# Patient Record
Sex: Female | Born: 1940 | Hispanic: Refuse to answer | Marital: Married
Health system: Midwestern US, Academic
[De-identification: ages and names within clinical notes are randomized; demographics above are authoritative.]

---

## 2017-01-14 IMAGING — US DUPRENAL
1 series · 14 of 25 positions shown · non-contrast
Comparison: none

[Series 1: us duplex renal artery · 14 of 41 slices shown]
[im 1/41]
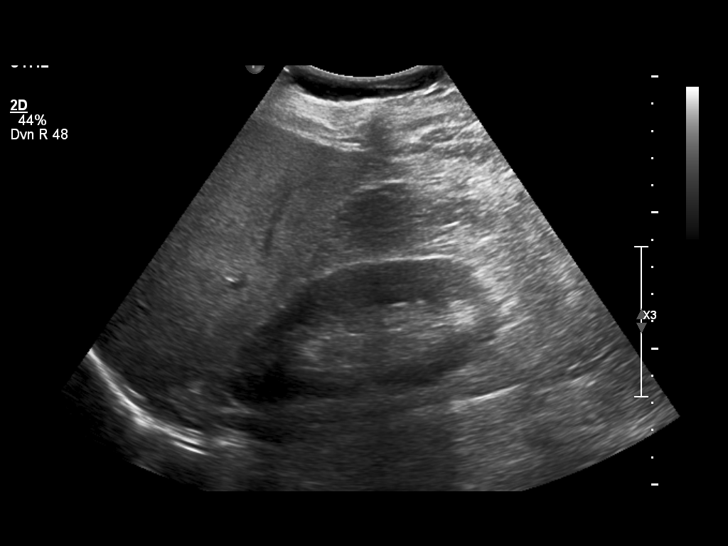
[im 4/41]
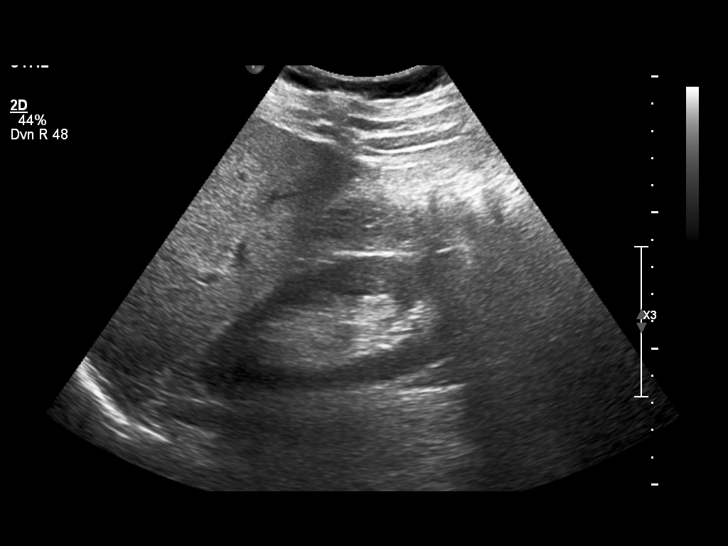
[im 7/41]
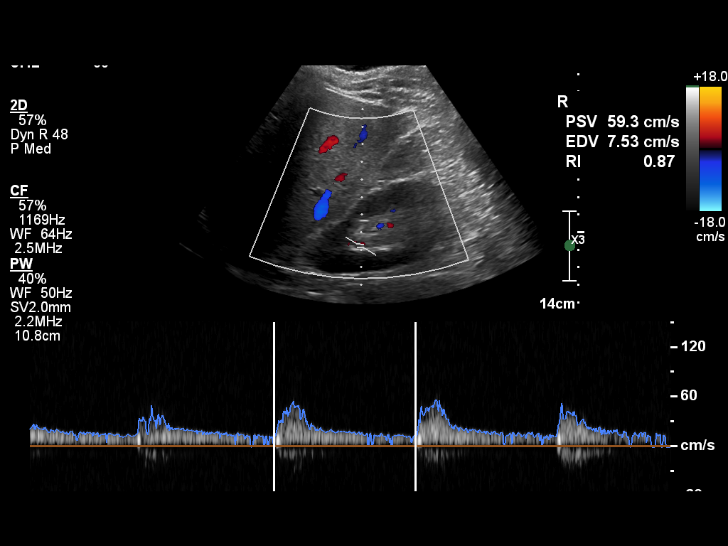
[im 11/41]
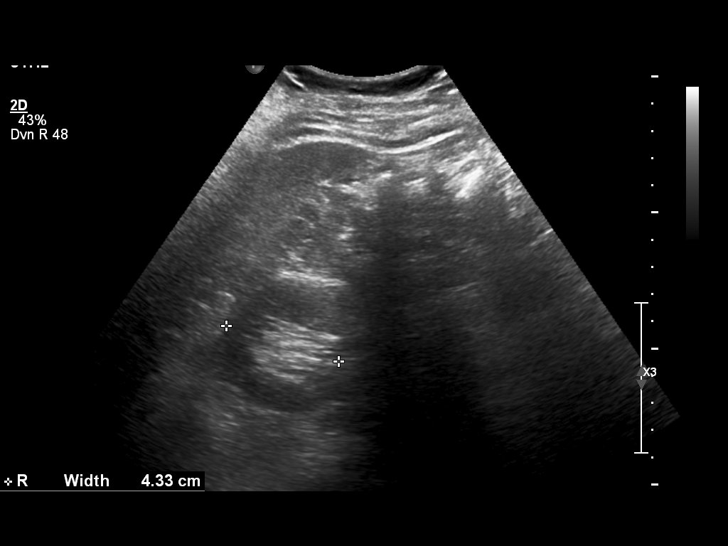
[im 14/41]
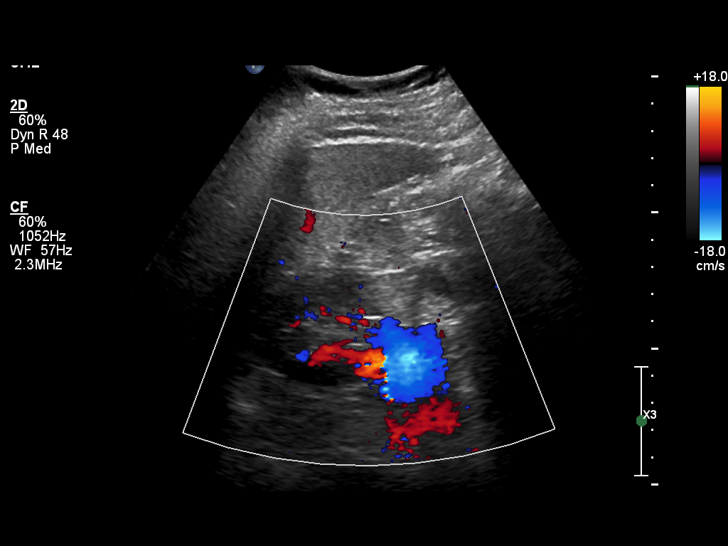
[im 16/41]
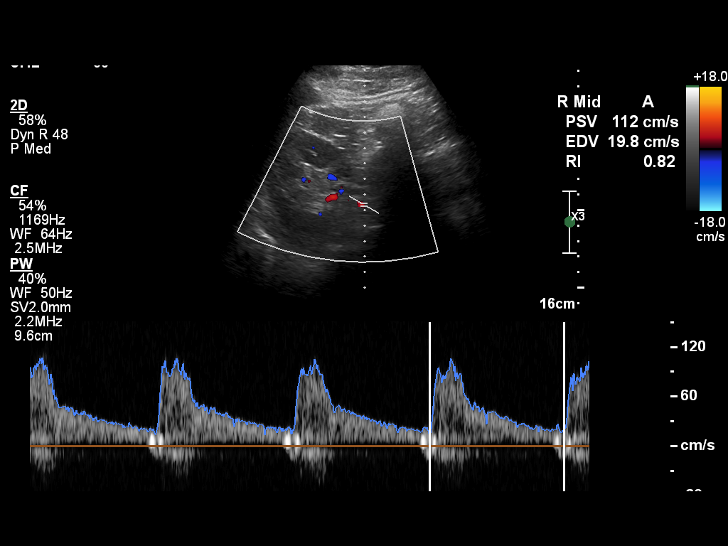
[im 19/41]
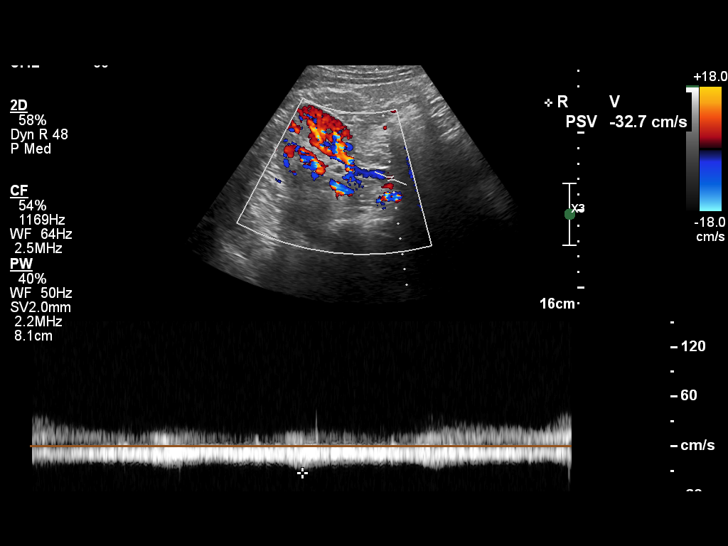
[im 22/41]
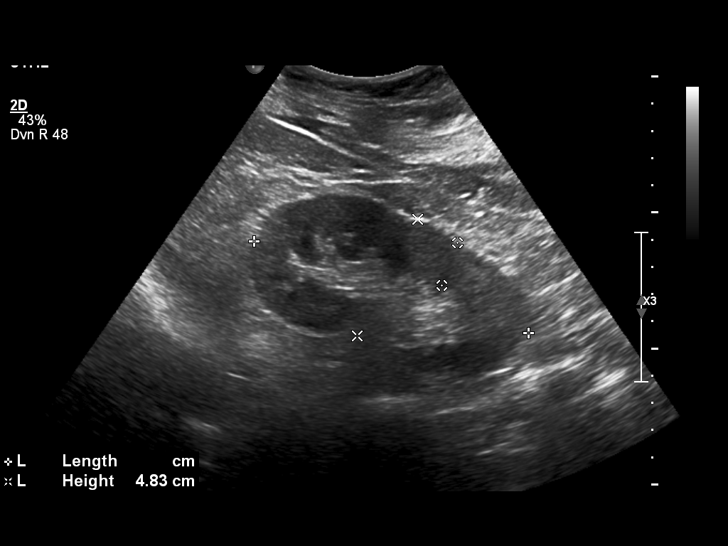
[im 26/41]
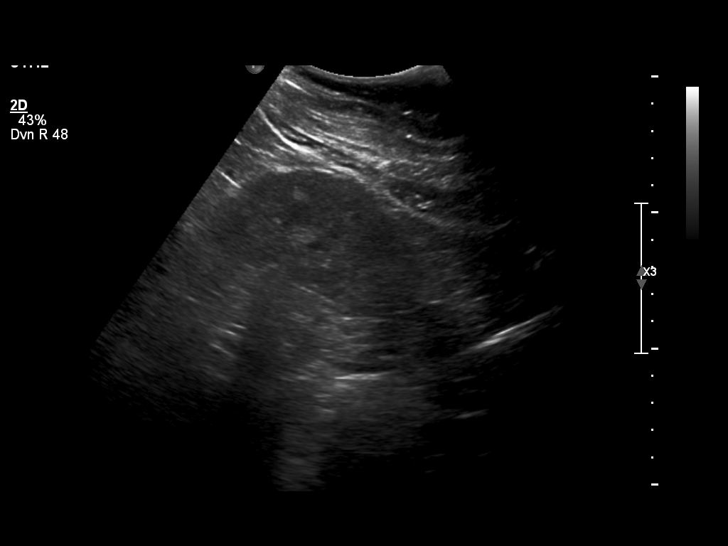
[im 27/41]
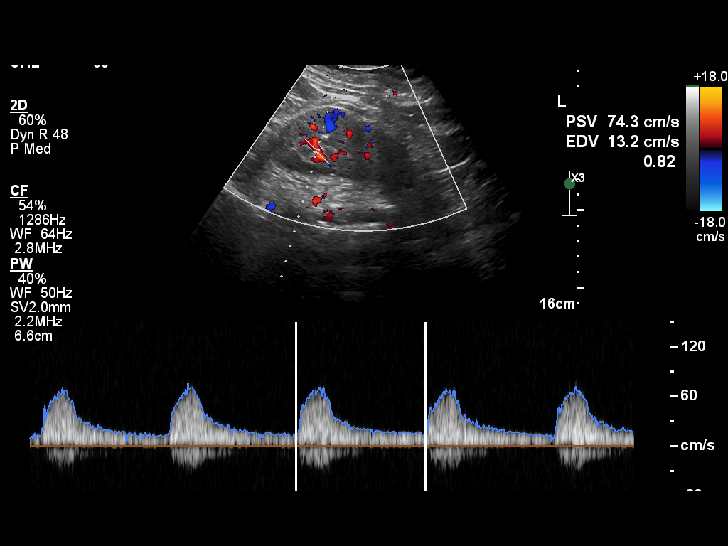
[im 31/41]
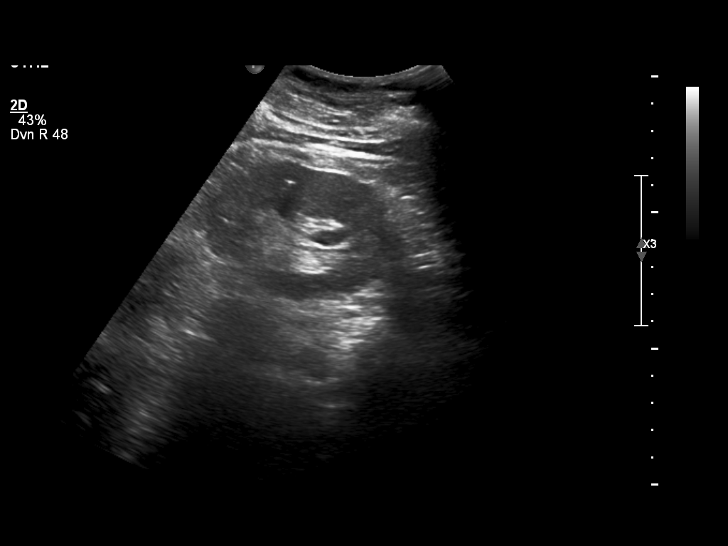
[im 34/41]
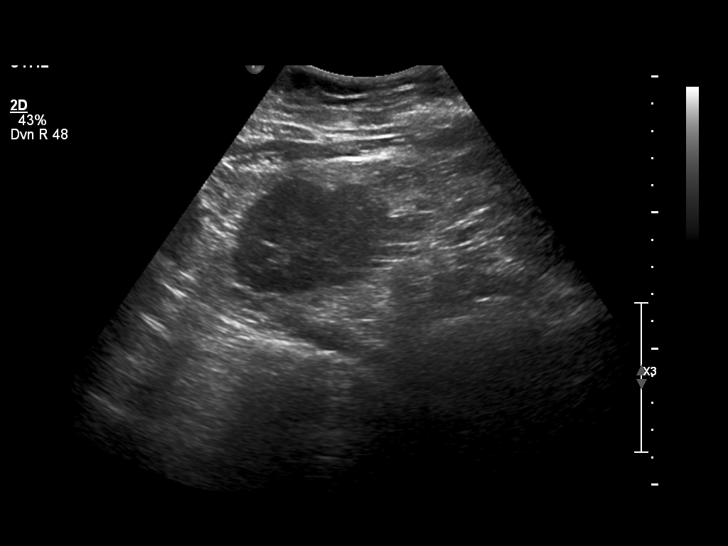
[im 37/41]
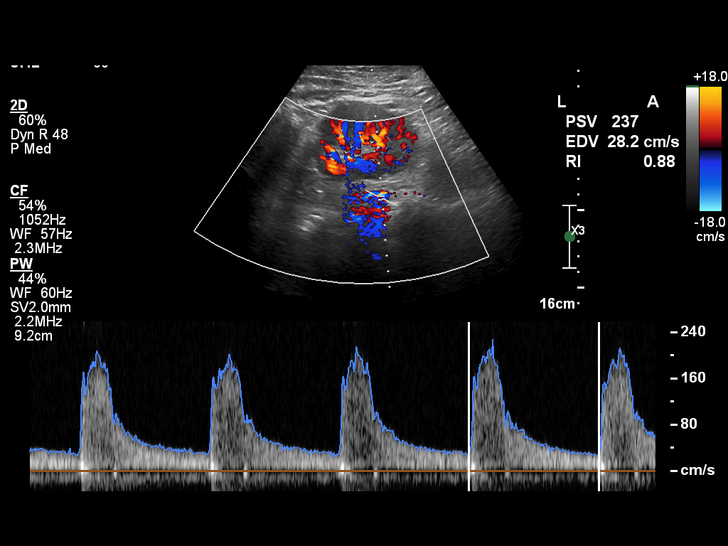
[im 41/41]
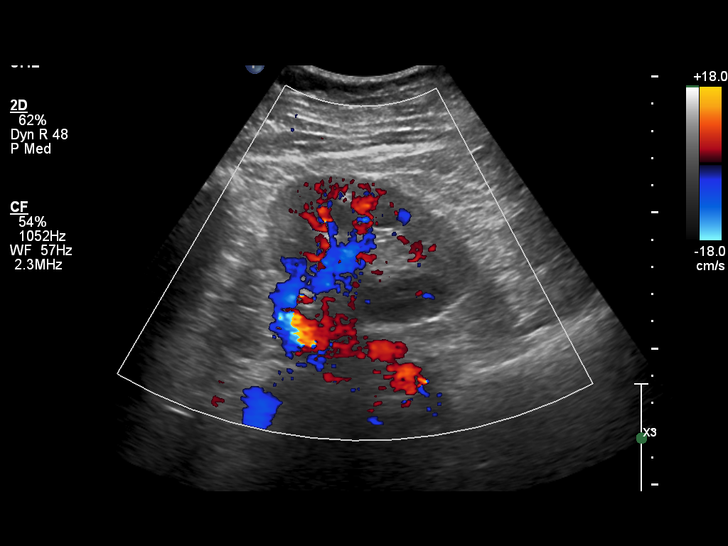

[14 of 25 positions shown; findings below may reference images not displayed]

ULTRASOUND REPORT

EXAM

Renal duplex Doppler ultrasound

INDICATION

HTN, HYPERCHOLESTEROLEMIA
HTN AND HYPERCHOLESTEROLEMIA. HX OF HTN AND SMOKING.

TECHNIQUE

Multiple sonographic images of the bilateral kidneys were obtained. Duplex Doppler images of the
renal arteries were also obtained.

COMPARISONS

None available

FINDINGS

The bilateral kidneys are normal in size, contour and echogenicity without evidence of mass, cyst,
hydronephrosis or shadowing calculus. The visualized bladder is unremarkable. There is a mildly
elevated flow velocity at the origin of the left renal artery at 237 centimeters/second with a
renal aortic ratio of 2.3. The bilateral renal veins are patent.

IMPRESSION

Mild-to-moderate atherosclerotic disease of the bilateral renal arteries without evidence of
clinically significant stenosis. Moderate narrowing of the proximal left renal artery with elevated
flow velocity of 237 centimeters per 2nd but with renal aortic ratio below 3.5 criteria.

## 2017-09-03 ENCOUNTER — Encounter: Admit: 2017-09-03 | Discharge: 2017-09-03 | Payer: MEDICARE

## 2017-09-03 MED ORDER — EZETIMIBE 10 MG PO TAB
10 mg | ORAL_TABLET | Freq: Every day | ORAL | 0 refills | Status: AC
Start: 2017-09-03 — End: 2017-12-02

## 2017-11-07 ENCOUNTER — Encounter: Admit: 2017-11-07 | Discharge: 2017-11-07 | Payer: MEDICARE

## 2017-11-09 MED ORDER — FENOFIBRATE NANOCRYSTALLIZED 145 MG PO TAB
ORAL_TABLET | Freq: Every day | ORAL | 0 refills | 30.00000 days | Status: AC
Start: 2017-11-09 — End: 2018-02-03

## 2017-11-28 ENCOUNTER — Encounter: Admit: 2017-11-28 | Discharge: 2017-11-28 | Payer: MEDICARE

## 2017-11-30 ENCOUNTER — Encounter: Admit: 2017-11-30 | Discharge: 2017-11-30 | Payer: MEDICARE

## 2017-11-30 MED ORDER — SIMVASTATIN 40 MG PO TAB
ORAL_TABLET | Freq: Every evening | 3 refills | Status: AC
Start: 2017-11-30 — End: 2018-02-09

## 2017-12-02 MED ORDER — EZETIMIBE 10 MG PO TAB
ORAL_TABLET | Freq: Every day | 3 refills | Status: AC
Start: 2017-12-02 — End: 2018-11-23

## 2017-12-08 ENCOUNTER — Encounter: Admit: 2017-12-08 | Discharge: 2017-12-08 | Payer: MEDICARE

## 2017-12-08 MED ORDER — LOSARTAN-HYDROCHLOROTHIAZIDE 100-25 MG PO TAB
1 | ORAL_TABLET | Freq: Every morning | ORAL | 0 refills | 28.00000 days | Status: AC
Start: 2017-12-08 — End: 2017-12-21

## 2017-12-21 ENCOUNTER — Encounter: Admit: 2017-12-21 | Discharge: 2017-12-21 | Payer: MEDICARE

## 2017-12-21 MED ORDER — LOSARTAN-HYDROCHLOROTHIAZIDE 100-25 MG PO TAB
1 | ORAL_TABLET | Freq: Every morning | ORAL | 0 refills | 28.00000 days | Status: AC
Start: 2017-12-21 — End: 2018-02-25

## 2018-02-03 ENCOUNTER — Encounter: Admit: 2018-02-03 | Discharge: 2018-02-03 | Payer: MEDICARE

## 2018-02-03 MED ORDER — FENOFIBRATE NANOCRYSTALLIZED 145 MG PO TAB
ORAL_TABLET | Freq: Every day | ORAL | 3 refills | 30.00000 days | Status: AC
Start: 2018-02-03 — End: 2019-02-14

## 2018-02-04 ENCOUNTER — Encounter: Admit: 2018-02-04 | Discharge: 2018-02-04 | Payer: MEDICARE

## 2018-02-04 DIAGNOSIS — I10 Essential (primary) hypertension: ICD-10-CM

## 2018-02-04 DIAGNOSIS — E7800 Pure hypercholesterolemia, unspecified: Principal | ICD-10-CM

## 2018-02-05 ENCOUNTER — Encounter: Admit: 2018-02-05 | Discharge: 2018-02-05 | Payer: MEDICARE

## 2018-02-05 DIAGNOSIS — I10 Essential (primary) hypertension: ICD-10-CM

## 2018-02-05 DIAGNOSIS — E7800 Pure hypercholesterolemia, unspecified: Principal | ICD-10-CM

## 2018-02-05 LAB — LIPID PROFILE
Lab: 112 — ABNORMAL HIGH (ref <100)
Lab: 198 FL (ref 7–11)
Lab: 217 — ABNORMAL HIGH (ref 30–200)
Lab: 4
Lab: 43 — ABNORMAL HIGH (ref 5–40)

## 2018-02-05 LAB — COMPREHENSIVE METABOLIC PANEL
Lab: 0.6
Lab: 21
Lab: 21
Lab: 4
Lab: 7.5
Lab: 14
Lab: 140 % — ABNORMAL LOW (ref 40–50)
Lab: 26
Lab: 52

## 2018-02-09 ENCOUNTER — Ambulatory Visit: Admit: 2018-02-09 | Discharge: 2018-02-10 | Payer: MEDICARE

## 2018-02-09 ENCOUNTER — Encounter: Admit: 2018-02-09 | Discharge: 2018-02-09 | Payer: MEDICARE

## 2018-02-09 DIAGNOSIS — I10 Essential (primary) hypertension: ICD-10-CM

## 2018-02-09 DIAGNOSIS — Z9189 Other specified personal risk factors, not elsewhere classified: Principal | ICD-10-CM

## 2018-02-09 DIAGNOSIS — E7800 Pure hypercholesterolemia, unspecified: ICD-10-CM

## 2018-02-09 DIAGNOSIS — I6522 Occlusion and stenosis of left carotid artery: ICD-10-CM

## 2018-02-09 DIAGNOSIS — Z0189 Encounter for other specified special examinations: ICD-10-CM

## 2018-02-09 MED ORDER — ICOSAPENT ETHYL 1 GRAM PO CAP
2 | ORAL_CAPSULE | Freq: Two times a day (BID) | ORAL | 11 refills | Status: AC
Start: 2018-02-09 — End: 2018-03-04

## 2018-02-09 MED ORDER — ROSUVASTATIN 40 MG PO TAB
40 mg | ORAL_TABLET | Freq: Every day | ORAL | 3 refills | 90.00000 days | Status: AC
Start: 2018-02-09 — End: 2019-02-04

## 2018-02-22 ENCOUNTER — Encounter: Admit: 2018-02-22 | Discharge: 2018-02-22 | Payer: MEDICARE

## 2018-02-22 LAB — COMPREHENSIVE METABOLIC PANEL
Lab: 14
Lab: 21
Lab: 26
Lab: 59
Lab: 80

## 2018-02-22 LAB — BNP (B-TYPE NATRIURETIC PEPTI): Lab: 169

## 2018-02-22 LAB — THYROID STIMULATING HORMONE-TSH: Lab: 4.2 — ABNORMAL HIGH (ref 60–99)

## 2018-02-22 MED ORDER — HYDROCHLOROTHIAZIDE 25 MG PO TAB
50 mg | ORAL_TABLET | Freq: Every morning | ORAL | 3 refills | 28.00000 days | Status: AC
Start: 2018-02-22 — End: 2018-02-22

## 2018-02-22 MED ORDER — LOSARTAN 100 MG PO TAB
100 mg | ORAL_TABLET | Freq: Every day | ORAL | 3 refills | 30.00000 days | Status: AC
Start: 2018-02-22 — End: 2018-03-09

## 2018-02-22 MED ORDER — HYDROCHLOROTHIAZIDE 25 MG PO TAB
25 mg | ORAL_TABLET | Freq: Every morning | ORAL | 3 refills | 28.00000 days | Status: AC
Start: 2018-02-22 — End: 2018-02-22

## 2018-02-22 MED ORDER — HYDROCHLOROTHIAZIDE 25 MG PO TAB
50 mg | ORAL_TABLET | Freq: Every morning | ORAL | 3 refills | 28.00000 days | Status: AC
Start: 2018-02-22 — End: 2018-02-25

## 2018-02-23 LAB — TROPONIN-I

## 2018-02-23 LAB — MAGNESIUM: Lab: 1.9 — ABNORMAL LOW (ref 12.0–16.0)

## 2018-02-24 LAB — CREATINE KINASE-CPK: Lab: 207 — ABNORMAL LOW (ref 35.0–45.0)

## 2018-02-24 LAB — HEMOGLOBIN A1C: Lab: 7.5 — ABNORMAL HIGH (ref <5.6)

## 2018-02-24 LAB — CBC: Lab: 13 — ABNORMAL HIGH (ref 4.0–10.8)

## 2018-02-24 LAB — BASIC METABOLIC PANEL
Lab: 137 — ABNORMAL LOW (ref 4.20–5.40)
Lab: 4
Lab: 8.8 — ABNORMAL LOW (ref 9.0–10.7)
Lab: >60
Lab: >60

## 2018-02-25 MED ORDER — HYDROCHLOROTHIAZIDE 25 MG PO TAB
25 mg | ORAL_TABLET | Freq: Every morning | ORAL | 3 refills | 28.00000 days | Status: AC
Start: 2018-02-25 — End: 2019-03-17

## 2018-03-01 ENCOUNTER — Encounter: Admit: 2018-03-01 | Discharge: 2018-03-01 | Payer: MEDICARE

## 2018-03-03 ENCOUNTER — Encounter: Admit: 2018-03-03 | Discharge: 2018-03-03 | Payer: MEDICARE

## 2018-03-04 MED ORDER — ICOSAPENT ETHYL 1 GRAM PO CAP
2 | ORAL_CAPSULE | Freq: Two times a day (BID) | ORAL | 3 refills | Status: AC
Start: 2018-03-04 — End: ?

## 2018-03-09 ENCOUNTER — Ambulatory Visit: Admit: 2018-03-09 | Discharge: 2018-03-10 | Payer: MEDICARE

## 2018-03-09 ENCOUNTER — Encounter: Admit: 2018-03-09 | Discharge: 2018-03-09 | Payer: MEDICARE

## 2018-03-09 DIAGNOSIS — E7800 Pure hypercholesterolemia, unspecified: Principal | ICD-10-CM

## 2018-03-09 DIAGNOSIS — I4891 Unspecified atrial fibrillation: Principal | ICD-10-CM

## 2018-03-09 DIAGNOSIS — I10 Essential (primary) hypertension: ICD-10-CM

## 2018-03-09 DIAGNOSIS — Z0189 Encounter for other specified special examinations: ICD-10-CM

## 2018-03-09 MED ORDER — APIXABAN 5 MG PO TAB
5 mg | ORAL_TABLET | Freq: Two times a day (BID) | ORAL | 0 refills | Status: AC
Start: 2018-03-09 — End: 2018-04-13

## 2018-04-06 ENCOUNTER — Encounter: Admit: 2018-04-06 | Discharge: 2018-04-06 | Payer: MEDICARE

## 2018-04-09 ENCOUNTER — Encounter: Admit: 2018-04-09 | Discharge: 2018-04-09 | Payer: MEDICARE

## 2018-04-13 ENCOUNTER — Encounter: Admit: 2018-04-13 | Discharge: 2018-04-13 | Payer: MEDICARE

## 2018-04-28 ENCOUNTER — Encounter: Admit: 2018-04-28 | Discharge: 2018-04-28 | Payer: MEDICARE

## 2018-04-28 DIAGNOSIS — E7800 Pure hypercholesterolemia, unspecified: Principal | ICD-10-CM

## 2018-04-28 DIAGNOSIS — I10 Essential (primary) hypertension: ICD-10-CM

## 2018-05-31 LAB — LIPID PROFILE
Lab: 131
Lab: 3
Lab: 40

## 2018-05-31 LAB — LIVER FUNCTION PANEL
Lab: 0.2 — ABNORMAL LOW (ref 150–200)
Lab: 0.5
Lab: 21
Lab: 24

## 2018-06-02 ENCOUNTER — Encounter: Admit: 2018-06-02 | Discharge: 2018-06-02 | Payer: MEDICARE

## 2018-06-02 DIAGNOSIS — E7800 Pure hypercholesterolemia, unspecified: Principal | ICD-10-CM

## 2018-06-02 DIAGNOSIS — I10 Essential (primary) hypertension: ICD-10-CM

## 2018-07-06 ENCOUNTER — Encounter: Admit: 2018-07-06 | Discharge: 2018-07-06 | Payer: MEDICARE

## 2018-07-20 ENCOUNTER — Encounter: Admit: 2018-07-20 | Discharge: 2018-07-20 | Payer: MEDICARE

## 2018-07-20 ENCOUNTER — Ambulatory Visit: Admit: 2018-07-20 | Discharge: 2018-07-21 | Payer: MEDICARE

## 2018-07-20 DIAGNOSIS — E785 Hyperlipidemia, unspecified: ICD-10-CM

## 2018-07-20 DIAGNOSIS — Z0189 Encounter for other specified special examinations: ICD-10-CM

## 2018-07-20 DIAGNOSIS — I10 Essential (primary) hypertension: ICD-10-CM

## 2018-07-20 DIAGNOSIS — I480 Paroxysmal atrial fibrillation: Principal | ICD-10-CM

## 2018-07-20 DIAGNOSIS — E782 Mixed hyperlipidemia: ICD-10-CM

## 2018-07-20 DIAGNOSIS — E7800 Pure hypercholesterolemia, unspecified: Principal | ICD-10-CM

## 2018-07-23 ENCOUNTER — Encounter: Admit: 2018-07-23 | Discharge: 2018-07-23 | Payer: MEDICARE

## 2018-07-23 DIAGNOSIS — I10 Essential (primary) hypertension: ICD-10-CM

## 2018-07-23 DIAGNOSIS — E785 Hyperlipidemia, unspecified: ICD-10-CM

## 2018-07-23 DIAGNOSIS — I480 Paroxysmal atrial fibrillation: Principal | ICD-10-CM

## 2018-07-23 LAB — LIPID PROFILE
Lab: 128
Lab: 134 — ABNORMAL LOW (ref 150–200)
Lab: 46
Lab: 64
Lab: 26
Lab: 3

## 2018-11-23 ENCOUNTER — Encounter: Admit: 2018-11-23 | Discharge: 2018-11-23 | Payer: MEDICARE

## 2018-11-23 MED ORDER — EZETIMIBE 10 MG PO TAB
ORAL_TABLET | Freq: Every day | 2 refills | Status: AC
Start: 2018-11-23 — End: 2019-09-23

## 2019-02-04 ENCOUNTER — Encounter: Admit: 2019-02-04 | Discharge: 2019-02-04 | Payer: MEDICARE

## 2019-02-04 MED ORDER — ROSUVASTATIN 40 MG PO TAB
ORAL_TABLET | Freq: Every day | ORAL | 3 refills | 90.00000 days | Status: AC
Start: 2019-02-04 — End: 2020-01-24

## 2019-02-14 ENCOUNTER — Encounter: Admit: 2019-02-14 | Discharge: 2019-02-14 | Payer: MEDICARE

## 2019-02-14 MED ORDER — FENOFIBRATE NANOCRYSTALLIZED 145 MG PO TAB
145 mg | ORAL_TABLET | Freq: Every day | ORAL | 1 refills | 30.00000 days | Status: AC
Start: 2019-02-14 — End: 2019-09-02

## 2019-03-17 ENCOUNTER — Encounter: Admit: 2019-03-17 | Discharge: 2019-03-17 | Payer: MEDICARE

## 2019-03-17 DIAGNOSIS — I10 Essential (primary) hypertension: Principal | ICD-10-CM

## 2019-03-17 MED ORDER — LOSARTAN 100 MG PO TAB
100 mg | ORAL_TABLET | Freq: Every day | ORAL | 3 refills | 30.00000 days | Status: AC
Start: 2019-03-17 — End: 2020-02-24

## 2019-03-17 MED ORDER — HYDROCHLOROTHIAZIDE 25 MG PO TAB
25 mg | ORAL_TABLET | Freq: Every morning | ORAL | 3 refills | 28.00000 days | Status: AC
Start: 2019-03-17 — End: 2020-03-05

## 2019-03-17 NOTE — Telephone Encounter
Received request for refill via fax, refilled as prescribed.  Patient due for annual BMP.  Will mail lab order and letter to patient.

## 2019-08-08 ENCOUNTER — Encounter: Admit: 2019-08-08 | Discharge: 2019-08-08

## 2019-08-08 DIAGNOSIS — E782 Mixed hyperlipidemia: Secondary | ICD-10-CM

## 2019-08-08 NOTE — Telephone Encounter
Lab orders faxed to [facility] lab and asked pt to have labs done prior to upcoming OV.  Pt verbalizes understanding.

## 2019-08-15 ENCOUNTER — Encounter: Admit: 2019-08-15 | Discharge: 2019-08-15 | Payer: MEDICARE

## 2019-08-15 DIAGNOSIS — I10 Essential (primary) hypertension: Secondary | ICD-10-CM

## 2019-08-15 DIAGNOSIS — E782 Mixed hyperlipidemia: Secondary | ICD-10-CM

## 2019-08-15 LAB — BASIC METABOLIC PANEL
Lab: 15 — ABNORMAL HIGH (ref 0–14)
Lab: 0.8
Lab: 105
Lab: 140
Lab: 22 — ABNORMAL HIGH (ref 9.8–20.1)
Lab: 24
Lab: 3.8

## 2019-08-15 LAB — LIPID PROFILE: Lab: 123

## 2019-08-15 LAB — LIVER FUNCTION PANEL: Lab: 0.6

## 2019-08-23 ENCOUNTER — Encounter: Admit: 2019-08-23 | Discharge: 2019-08-23

## 2019-08-23 ENCOUNTER — Ambulatory Visit: Admit: 2019-08-23 | Discharge: 2019-08-24

## 2019-08-23 DIAGNOSIS — E7800 Pure hypercholesterolemia, unspecified: Secondary | ICD-10-CM

## 2019-08-23 DIAGNOSIS — I480 Paroxysmal atrial fibrillation: Secondary | ICD-10-CM

## 2019-08-23 DIAGNOSIS — Z0189 Encounter for other specified special examinations: Secondary | ICD-10-CM

## 2019-08-23 DIAGNOSIS — I6522 Occlusion and stenosis of left carotid artery: Secondary | ICD-10-CM

## 2019-08-23 DIAGNOSIS — I10 Essential (primary) hypertension: Secondary | ICD-10-CM

## 2019-08-23 DIAGNOSIS — E782 Mixed hyperlipidemia: Secondary | ICD-10-CM

## 2019-08-23 MED ORDER — SPIRONOLACTONE 25 MG PO TAB
25 mg | ORAL_TABLET | Freq: Every day | ORAL | 3 refills | 46.00000 days | Status: AC
Start: 2019-08-23 — End: ?

## 2019-08-23 MED ORDER — EPLERENONE 25 MG PO TAB
25 mg | ORAL_TABLET | Freq: Every day | ORAL | 3 refills | 90.00000 days | Status: DC
Start: 2019-08-23 — End: 2019-08-23

## 2019-08-23 MED ORDER — AMILORIDE 5 MG PO TAB
ORAL_TABLET | 0 refills
Start: 2019-08-23 — End: ?

## 2019-08-23 NOTE — Progress Notes
Date of Service: 08/23/2019    Sharon Cooley is a 78 y.o. female.       HPI     Mrs. Kretzschmar returns for follow-up of her carotid disease is never been symptomatic, essential hypertension, hyperlipidemia and paroxysmal atrial fibrillation that this point appears to have been stress related and always triggered by urgent laparoscopic cholecystectomy in February 2019.  Since I have seen her last she is not had she is not had any symptoms suggesting recurrent atrial fibrillation.  She has no angina.  She is a engage in activities of daily living without limitations from progressive shortness of breath exertional chest pressure or pain or claudication.  She has no leg edema.  She is tolerating her medications without adverse effect.  She does not check blood pressure often but does not believe that her readings are commonly below 130/80.         Vitals:    08/23/19 0955 08/23/19 1000   BP: 138/74 136/70   BP Source: Arm, Left Upper Arm, Right Upper   Pulse: 57    Temp: 36.6 ???C (97.9 ???F)    SpO2: 97%    Weight: 62.1 kg (137 lb)    Height: 1.549 m (5' 1)    PainSc: Zero      Body mass index is 25.89 kg/m???.     Past Medical History  Patient Active Problem List    Diagnosis Date Noted   ??? Paroxysmal atrial fibrillation (HCC) 07/20/2018     02/12/18 Paroxymal atrial fibrillation day of urgent  laparoscopic cholecystectomy. 2 EKG 11:47 AM that show atrial fib with ventricular response about 115-120 and  EKG 9:57 PM   showing sinus rhythm  03/26/2018 2-week rhythm monitor shows no A. fib or flutter no complex ventricular ectopy or pauses.     ??? 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 Chronic bilateral low back pain without sciatica 10/23/2016     Back surgeris 2013, & 2014 [facility] Dr. [place] Pugh Pain improved but not resolved.      ??? Mixed hypercholesterolemia and hypertriglyceridemia      Hypercholesterolemia, initial treatment from 1980s a. 04/30/04 total 178 trig 190 HDL 42 LDLd 92 Lipitor 10 after lopid, tricor         b. 6/06 total 177 trig 172 HDl 43 LDL 102 Lipitor 10        c. 01/21/06 total 318 trig 316 HDL 48 LDLd 226 6 months post Lipitor DC dt liver enzyme rise            AST 22 ALT 47 Jan ''07, Zetia 10 begun, 3/07 Crestor 5 added        d. 06/16/06 total 171 Trig 186 HDL 53 LDLd 98 Crestor 5, zetia 10        e. 08/25/07 total 163 trig 178 HDL 44 LDL 93 Crestor 5, zetia 10.        ??? Essential hypertension    ??? Observation, suspected cardiovascular disease      a. 03/03/96 8 min 50 sec Bruce ExTreadmill, 90% HR, no CP, at ST depress approx 1 mm Dr. Andreas Newport  b. 04/20/06 BruceExEcho 23 sec, 93% HR, AoSclerosis, EF 60% no ischemia  c. 10/28/07-9'19 Bruce Stress echo: LV 4.6, EF 65-70%. HR 140, No ischemia or Angina              Review of Systems   Constitution: Negative.   HENT: Positive for hearing loss.    Eyes: Negative.  Cardiovascular: Positive for claudication.   Respiratory: Negative.    Endocrine: Negative.    Hematologic/Lymphatic: Negative.    Skin: Negative.    Musculoskeletal: Positive for back pain.   Gastrointestinal: Negative.    Genitourinary: Negative.    Neurological: Negative.    Psychiatric/Behavioral: The patient is nervous/anxious.    Allergic/Immunologic: Negative.        Physical Exam  General: Patient in no distress, looks generally healthy. Skin warm and dry, non icteric,    Mucous membranes moist  Pupils equal and round  Carotids: Faint radiation murmur to base of neck, Gr I L  carotid bruit.  Thyroid not enlarged.  Neck veins: CVP <6 normal, no V wave, no HJR     Respiratory: Breathing comfortably. Lungs clear to percussion & auscultation. No rales, rhonchi or wheezing   Cardiac: Regular rhythm. LV impulse not palpable. Normal S1 & S2, Fourth heart sound, no rub or S3.  Gr i-ii/vi parasternal early systolic murmur radiating toward neck.  Gr 1/6 near holodiastolic murmur along the left sternal border that I did not report on last exam in 2019  Abdomen soft, non-tender, no masses or bruits, No hepatic or aortic enlargementsuspected. + bowel sounds.   Femoral arteries: Good pulses, no bruits.  Legs/feet: Normal PT pulses, no edema.   Motor: Normal muscle strength.      Cognitive: Good insight. No depression     Cardiovascular Studies  EKG today shows sinus bradycardia rate 54 with minor T wave changes.    Her lipid profile from last week shows a triglyceride reading in the low normal range less than half of where it had been in the past.  She has not been taking Vascepa in the recent past because of cost meaning that her Zetia and statin are highly effective and did not need further supplementation with reconsideration of Vascepa.   08/15/2019 00:00   Cholesterol 123   Triglycerides 125   HDL 42   LDL 56     Problems Addressed Today  No diagnosis found.    Assessment and Plan     Mrs. Sarduy is doing well with no symptoms of angina and minimal symptoms of palpitations.  I do not believe she is having atrial fib just some anxiety related percussive symptoms of normal rhythm.    Her blood pressures are higher than ideal with the readings above 130 on good doses of hydrochlorothiazide and losartan.  With her potassium at 3.8 last week it is an ideal situation to add eplerenone which can improve blood pressure control and reverse her trend toward hypokalemia.    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  I think we should recheck the echo Doppler at her convenience to make sure that the AI is not more severe than I am estimating by exam.    Her lipid control looks good on  rosuvastatin 40 and Zetia 10 10.  She has had asymptomatic carotid disease in the past with elevated triglycerides.  Everything is better controlled right now on that current medication. Addendum after she left a but before I did close this encounter we were notified that her carrier did not want to proceed dispense eplerenone without a trial of Aldactone.  There is really no reason not to try Aldactone although I think the side effect profile is a little less favorable than eplerenone.  I reassured  a prescription for aldosterone blockade using L spironolactone rather than eplerenone.      NB: The free text in this document was generated through Dragon(TM) software with editing and proofreading  done by the author of this document Dr. Mable Paris MD, [facility] principally at the point of care. Some errors may persist.  If there are questions about content in this document please contact Dr. Hale Bogus.    The written information I provided Ms. Roher at the conclusion of today's encounter is as  follows:    Patient Instructions   Your cholesterol profile looks really good without the vast C-spine.  You can stay just on the rosuvastatin and Zetia.  You must be limiting your carbohydrates more than you think because that helps keep the triglycerides down.    Your blood pressure is higher than ideal.  I want you to start a drug called eplerenone which will boost your blood pressure control and keep you from needing potassium supplement.  It really does not have many side effects other than pushing the potassium too high in some people.  With your potassium below 4 you are at low risk for having any potassium problems.  Nevertheless we need to recheck your potassium in about 2 weeks after you have started the eplerenone.    Your heart pounding is probably just her anxiety reaction and its somewhat expected given your situation.  I do not think you need to do any monitoring to check on atrial fibrillation or to consider other medications.  Just let me know if you start to feel more palpitations particularly if  you feel that that your heart is beating irregularly and rapidly. As I mentioned you have a heart murmur today that was not present last year.  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  You have had a very slight leaks of this on echoes in the past but that was when I could not hear the murmur.  The murmur is not that loud.  I suspect that the leakage is still mild and not moderate and definitely not severe but I think we need to take another look to make sure it is not leaking more than I think it is by exam.    I think once a year with me is all you need to schedule.  If you do not hear from [place] about the results of the echo Doppler within a couple of weeks call back in to make sure that everything looked good.  I do not think that anything is going looks seriously wrong but you want to make sure that we tell you that and do not just presume that if you do not hear anything there is nothing to worry about.    Call in if you have problems or questions.   Marissa Nestle, MD     Blood test for potassium in two weeks BMP.              Current Medications (including today's revisions)  ??? aspirin EC 81 mg tablet Take 81 mg by mouth daily. Take with food.   ??? CALCIUM CARBONATE/VITAMIN D3 (CALCIUM 600 + D PO) Take 1 Tab by mouth daily.   ??? cetirizine (ZYRTEC) 10 mg PO tablet Take 10 mg by mouth daily.   ??? diazepam (VALIUM) 2 mg PO tablet Take 2 mg by mouth every 6 hours as needed.   ??? ezetimibe (ZETIA) 10 mg  tablet TAKE 1 TABLET BY MOUTH EVERY DAY   ??? fenofibrate nanocrystallized (TRICOR) 145 mg tablet Take one tablet by mouth daily. Take with food.   ??? hydroCHLOROthiazide (HYDRODIURIL) 25 mg tablet Take one tablet by mouth every morning.   ??? icosapent ethyl 1 gram cap Take two capsules by mouth twice daily.   ??? losartan (COZAAR) 100 mg tablet Take one tablet by mouth daily.   ??? lutein 20 mg tab Take 1 tablet by mouth daily.   ??? paroxetine (PAXIL) 10 mg PO tablet Take 10 mg by mouth daily. ??? rosuvastatin (CRESTOR) 40 mg tablet TAKE 1 TABLET BY MOUTH EVERY DAY

## 2019-08-23 NOTE — Progress Notes
CBP ordered echo. Pt requested it to be done at [facility].  Order entered and faxed to [facility] scheduling. Scheduling will call pt directly to schedule.  Pt aware.

## 2019-08-25 ENCOUNTER — Encounter: Admit: 2019-08-25 | Discharge: 2019-08-25

## 2019-08-26 ENCOUNTER — Encounter: Admit: 2019-08-26 | Discharge: 2019-08-26

## 2019-08-26 ENCOUNTER — Ambulatory Visit: Admit: 2019-08-26 | Discharge: 2019-08-27

## 2019-08-26 DIAGNOSIS — I480 Paroxysmal atrial fibrillation: Secondary | ICD-10-CM

## 2019-09-01 ENCOUNTER — Encounter: Admit: 2019-09-01 | Discharge: 2019-09-01

## 2019-09-01 DIAGNOSIS — I10 Essential (primary) hypertension: Secondary | ICD-10-CM

## 2019-09-01 DIAGNOSIS — I480 Paroxysmal atrial fibrillation: Secondary | ICD-10-CM

## 2019-09-01 NOTE — Telephone Encounter
Lab order faxed to [facility] lab.  Staff message sent to [place] to call pt next week to remind pt to have labs done.

## 2019-09-02 ENCOUNTER — Encounter: Admit: 2019-09-02 | Discharge: 2019-09-02

## 2019-09-02 MED ORDER — FENOFIBRATE NANOCRYSTALLIZED 145 MG PO TAB
145 mg | ORAL_TABLET | Freq: Every day | ORAL | 3 refills | 30.00000 days | Status: AC
Start: 2019-09-02 — End: ?

## 2019-09-07 NOTE — Telephone Encounter
LMOM reminding pt to have labs completed.  Await results.

## 2019-09-12 ENCOUNTER — Encounter: Admit: 2019-09-12 | Discharge: 2019-09-12 | Payer: MEDICARE

## 2019-09-12 LAB — BASIC METABOLIC PANEL
Lab: 0.8
Lab: 104
Lab: 140
Lab: 158 — ABNORMAL HIGH (ref 70–105)
Lab: 20
Lab: 25
Lab: 4.3
Lab: 9.8

## 2019-09-23 ENCOUNTER — Encounter: Admit: 2019-09-23 | Discharge: 2019-09-23 | Payer: MEDICARE

## 2019-09-23 MED ORDER — EZETIMIBE 10 MG PO TAB
10 mg | ORAL_TABLET | Freq: Every day | ORAL | 3 refills | Status: AC
Start: 2019-09-23 — End: ?

## 2019-09-23 NOTE — Telephone Encounter
Refilled patient's zetia and gave results of potassium and discussed echo.

## 2020-01-24 ENCOUNTER — Encounter: Admit: 2020-01-24 | Discharge: 2020-01-24 | Payer: MEDICARE

## 2020-01-24 MED ORDER — ROSUVASTATIN 40 MG PO TAB
ORAL_TABLET | Freq: Every day | ORAL | 3 refills | 90.00000 days | Status: AC
Start: 2020-01-24 — End: ?

## 2020-02-24 ENCOUNTER — Encounter: Admit: 2020-02-24 | Discharge: 2020-02-24 | Payer: MEDICARE

## 2020-02-24 MED ORDER — LOSARTAN 100 MG PO TAB
ORAL_TABLET | Freq: Every day | ORAL | 3 refills | 90.00000 days | Status: AC
Start: 2020-02-24 — End: ?

## 2020-03-04 ENCOUNTER — Encounter: Admit: 2020-03-04 | Discharge: 2020-03-04 | Payer: MEDICARE

## 2020-03-04 DIAGNOSIS — I10 Essential (primary) hypertension: Secondary | ICD-10-CM

## 2020-03-05 MED ORDER — HYDROCHLOROTHIAZIDE 25 MG PO TAB
ORAL_TABLET | Freq: Every day | ORAL | 3 refills | 28.00000 days | Status: AC
Start: 2020-03-05 — End: ?

## 2020-06-29 ENCOUNTER — Encounter: Admit: 2020-06-29 | Discharge: 2020-06-29 | Payer: MEDICARE

## 2020-07-09 ENCOUNTER — Encounter: Admit: 2020-07-09 | Discharge: 2020-07-09 | Payer: MEDICARE

## 2020-07-09 DIAGNOSIS — M545 Low back pain: Secondary | ICD-10-CM

## 2020-07-09 NOTE — Telephone Encounter
Attempt to contact pt in regards to upcoming appt with Dr. Carlson. LVM

## 2020-07-10 ENCOUNTER — Ambulatory Visit: Admit: 2020-07-10 | Discharge: 2020-07-10 | Payer: MEDICARE

## 2020-07-10 ENCOUNTER — Encounter: Admit: 2020-07-10 | Discharge: 2020-07-10 | Payer: MEDICARE

## 2020-07-10 DIAGNOSIS — E7800 Pure hypercholesterolemia, unspecified: Secondary | ICD-10-CM

## 2020-07-10 DIAGNOSIS — M5440 Lumbago with sciatica, unspecified side: Secondary | ICD-10-CM

## 2020-07-10 DIAGNOSIS — M48061 Spinal stenosis, lumbar region without neurogenic claudication: Secondary | ICD-10-CM

## 2020-07-10 DIAGNOSIS — M545 Low back pain: Secondary | ICD-10-CM

## 2020-07-10 DIAGNOSIS — F17200 Nicotine dependence, unspecified, uncomplicated: Secondary | ICD-10-CM

## 2020-07-10 DIAGNOSIS — I10 Essential (primary) hypertension: Secondary | ICD-10-CM

## 2020-07-10 DIAGNOSIS — M5136 Other intervertebral disc degeneration, lumbar region: Secondary | ICD-10-CM

## 2020-07-10 DIAGNOSIS — Z0189 Encounter for other specified special examinations: Secondary | ICD-10-CM

## 2020-07-10 DIAGNOSIS — Z7409 Other reduced mobility: Secondary | ICD-10-CM

## 2020-07-10 NOTE — Patient Instructions
It was a pleasure seeing you in clinic today.  Please don't hesitate to call if you have any questions.      Lititia Sen BSN, RN, CNOR  Clinical Nurse Coordinator  Dr. Brandon Carlson  [facility]  [facility]  [address]  [place], [place] [address]  [email or web address]  Phone: [phone]  Fax:  [phone]  Scheduling [phone]

## 2020-07-10 NOTE — Progress Notes
[facility] Pugh, [facility]  Spinal Surgery Consultation      CHIEF COMPLAINT     Chief Complaint   Patient presents with   ? Lower Back - Pain   ? Left Hip - Pain   ? Left Leg - Pain   ? New Patient       HISTORY OF PRESENT ILLNESS      EVADNE ISBILL is a 79 y.o. female who presents today for her low back pain and pain that goes down both her legs. Her left leg pain is worse than her right leg pain. She has difficulty carrying stuff like a load of laundry. She has difficulty with pulling weeds. She has difficulty vacuuming. She can stand for short period of time and she can walk household distance, however if she walks any further she gets worsening pain in her left glutes down her left leg the outside of her left leg into the lower leg down to her ankle. She does not have anything in the foot. The right leg she has pain in the same distribution down to her ankle. She did a 6-week course of physical therapy during March and April 2021 out of [facility] and reports this is not help. She does not necessarily have pain when she sleeps. She does smoke half pack a day and has for 50+ years. She has had 2 back surgeries what is appears to be laminectomy at L5-S1 in 2013 and a laminectomy at L4-L5 in 2014 for herniated disc. She has not had any injections in the last 7 years.    NSAIDS:    PT:    Pain medications:    Chiropractic:    Activity modification: Yes  Injections:    How many?  0    Previous Spine Surgery: microdiscectomy L5-s1, 2013 and microdiscectomy L4-5, 2014     PAST MEDICAL HISTORY     Medical History:   Diagnosis Date   ? Hypercholesterolemia 03/28/2010   ? Hypertension 03/28/2010   ? Tests ordered 03/28/2010       PAST SURGICAL HISTORY   No past surgical history on file.    FAMILY HISTORY   family history includes Heart Disease in her father; Heart Surgery in her father.    SOCIAL HISTORY     Social History     Socioeconomic History   ? Marital status: Married     Spouse name: Not on file   ? Number of children: Not on file   ? Years of education: Not on file   ? Highest education level: Not on file   Occupational History   ? Not on file   Tobacco Use   ? Smoking status: Current Every Day Smoker     Packs/day: 0.25     Types: Cigarettes   ? Smokeless tobacco: Never Used   Substance and Sexual Activity   ? Alcohol use: No   ? Drug use: No   ? Sexual activity: Not on file   Other Topics Concern   ? Not on file   Social History Narrative   ? Not on file       ALLERGIES   No Known Allergies    MEDICATIONS     Current Outpatient Medications:   ?  aspirin EC 81 mg tablet, Take 81 mg by mouth daily. Take with food., Disp: , Rfl:   ?  CALCIUM CARBONATE/VITAMIN D3 (CALCIUM 600 + D PO), Take 1 Tab by mouth daily.,  Disp: , Rfl:   ?  cetirizine (ZYRTEC) 10 mg PO tablet, Take 10 mg by mouth daily., Disp: , Rfl:   ?  diazepam (VALIUM) 2 mg PO tablet, Take 2 mg by mouth every 6 hours as needed., Disp: , Rfl:   ?  ezetimibe (ZETIA) 10 mg tablet, Take one tablet by mouth daily., Disp: 90 tablet, Rfl: 3  ?  fenofibrate nanocrystallized (TRICOR) 145 mg tablet, Take one tablet by mouth daily. Take with food., Disp: 90 tablet, Rfl: 3  ?  hydroCHLOROthiazide (HYDRODIURIL) 25 mg tablet, TAKE 1 TABLET BY MOUTH EVERY DAY IN THE MORNING, Disp: 90 tablet, Rfl: 3  ?  icosapent ethyl 1 gram cap, Take two capsules by mouth twice daily., Disp: 360 capsule, Rfl: 3  ?  losartan (COZAAR) 100 mg tablet, TAKE 1 TABLET BY MOUTH EVERY DAY, Disp: 90 tablet, Rfl: 3  ?  lutein 20 mg tab, Take 1 tablet by mouth daily., Disp: , Rfl:   ?  paroxetine (PAXIL) 10 mg PO tablet, Take 10 mg by mouth daily., Disp: , Rfl:   ?  rosuvastatin (CRESTOR) 40 mg tablet, TAKE 1 TABLET BY MOUTH EVERY DAY, Disp: 90 tablet, Rfl: 3  ?  spironolactone (ALDACTONE) 25 mg tablet, Take one tablet by mouth daily. Take with food., Disp: 90 tablet, Rfl: 3    REVIEW OF SYSTEMS   Review of Systems  A 10-point ROS was otherwise negative.  PHYSICAL EXAM   Blood pressure 116/59, pulse 65, temperature 36.6 ?C (97.9 ?F), height 154.9 cm (61), weight 61.2 kg (135 lb), SpO2 95 %.  Body mass index is 25.51 kg/m?Marland Kitchen       Pain Score: Zero    Constitutional: Alert, NAD  Psychiatric: Mood and affect appropriate  Eyes: EOMI  Respiratory: Unlabored respirations  Cardiovascular: Palpable radial and pedal pulses distally.  Skin: No rashes or lesions  Musculoskeletal:  Spine Exam:    Gait: Ambulates with a normal gait. Able to heel and toe walk without difficulty.   Stance: Balanced in the coronal and sagittal planes.     BACK:   No scars, rash or lesions.    PALPATION:  No pain in the midline or paraspinals. No pain at the SI joint or sciatic notch.    MOTOR:  Lower Ext. Iliopsoas Quads Hamstrings Gastroc Tib ant EHL   Right 5 5 5 5 5 5    Left 5 5 5 5 5 5      SENSATION:  Lower extremity: Sensation intact to light touch in L3-S1 distributions    REFLEXES:   Patellar Achilles   Right 2+ 2+   Left 2+ 2+     -Negative straight leg raise bilaterally  -Babinski's plantar bilaterally  -No clonus    RADIOGRAPHIC EVALUATION     EOS scoliosis films are reviewed from today. She is balanced in the coronal and sagittal planes. She has severe disc degeneration L5-S1 L4-L5 5. She maintaining her disc height at L3-4. She has significant disc collapse at L1-2 and L2-L3. There is foraminal narrowing at L4-L5. MRI available for review from outside institution from June 01, 2020. This shows severe foraminal stenosis on the left at L4-5 and L5-S1. She has moderate foraminal stenosis on the right at L4-5 L5-S1. I do not see any significant central stenosis. She has a severely calcified abdominal aorta.     ASSESSMENT / PLAN     OLAMAE FERRARA is a 79 y.o. female     1. Foraminal stenosis  of lumbar region  [place] [facility] INJECT SNRB/TFESI LUMBAR/SACRAL   2. DDD (degenerative disc disease), lumbar  [place] [facility] INJECT SNRB/TFESI LUMBAR/SACRAL   3. 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 Impaired mobility and activities of daily living  [place] [facility] INJECT SNRB/TFESI LUMBAR/SACRAL   6. Tobacco use disorder  [place] [facility] SP[facility] INJECT SNRB/TFESI LUMBAR/SACRAL        I have reviewed the imaging findings with the patient and explained how it correlates with her symptoms.  I would currently recommend pursuing non-operative treatments.  The patient is currently not a surgical candidate given her current nicotine use.  The patient would need to be completely nicotine free for 30 days prior to pursuing any type of operation.  We would check patient's cotinine level prior to surgery.  I have placed an order for a transforaminal epidural steroid injection at L4-5 and L5-S1 on the left.  We will plan to follow up with the patient 1 month after the injeciton.    Malachy Moan, APRN, FNP-C   Nurse Practitioner-Spine Surgery       NP to [place] [facility]. Lisette Grinder, MD, MPH  Spinal Surgery  [place]. [facility] Pugh, [facility]  Nurse: Laverle Patter, BSN, RN, CNOR   [phone]  -  [email or web address]

## 2020-07-13 ENCOUNTER — Encounter: Admit: 2020-07-13 | Discharge: 2020-07-13 | Payer: MEDICARE

## 2020-07-16 ENCOUNTER — Encounter: Admit: 2020-07-16 | Discharge: 2020-07-16 | Payer: MEDICARE

## 2020-07-16 ENCOUNTER — Ambulatory Visit: Admit: 2020-07-16 | Discharge: 2020-07-16 | Payer: MEDICARE

## 2020-07-16 DIAGNOSIS — I10 Essential (primary) hypertension: Secondary | ICD-10-CM

## 2020-07-16 DIAGNOSIS — E7800 Pure hypercholesterolemia, unspecified: Secondary | ICD-10-CM

## 2020-07-16 DIAGNOSIS — M48061 Spinal stenosis, lumbar region without neurogenic claudication: Secondary | ICD-10-CM

## 2020-07-16 DIAGNOSIS — Z0189 Encounter for other specified special examinations: Secondary | ICD-10-CM

## 2020-07-16 DIAGNOSIS — F17200 Nicotine dependence, unspecified, uncomplicated: Secondary | ICD-10-CM

## 2020-07-16 DIAGNOSIS — M5417 Radiculopathy, lumbosacral region: Secondary | ICD-10-CM

## 2020-07-16 DIAGNOSIS — M5440 Lumbago with sciatica, unspecified side: Secondary | ICD-10-CM

## 2020-07-16 DIAGNOSIS — Z7409 Other reduced mobility: Secondary | ICD-10-CM

## 2020-07-16 DIAGNOSIS — M5136 Other intervertebral disc degeneration, lumbar region: Secondary | ICD-10-CM

## 2020-07-16 MED ORDER — LIDOCAINE (PF) 10 MG/ML (1 %) IJ SOLN
2 mL | Freq: Once | INTRAMUSCULAR | 0 refills | Status: CP
Start: 2020-07-16 — End: ?

## 2020-07-16 MED ORDER — DEXAMETHASONE SODIUM PHOS (PF) 10 MG/ML IJ SOLN
10 mg | Freq: Once | 0 refills | Status: CP
Start: 2020-07-16 — End: ?

## 2020-07-16 MED ORDER — IOHEXOL 300 MG IODINE/ML IV SOLN
2 mL | Freq: Once | 0 refills | Status: CP
Start: 2020-07-16 — End: ?

## 2020-07-16 NOTE — Procedures
Attending Surgeon: Mikal Plane, DO    Anesthesia: Local    Pre-Procedure Diagnosis:   1. Lumbosacral radiculopathy    2. DDD (degenerative disc disease), lumbar    3. Chronic midline low back pain with sciatica, sciatica laterality unspecified    4. Foraminal stenosis of lumbar region    5. Spinal stenosis of lumbar region with radiculopathy    6. Impaired mobility and activities of daily living    7. Tobacco use disorder        Post-Procedure Diagnosis:   1. Lumbosacral radiculopathy    2. DDD (degenerative disc disease), lumbar    3. Chronic midline low back pain with sciatica, sciatica laterality unspecified    4. Foraminal stenosis of lumbar region    5. Spinal stenosis of lumbar region with radiculopathy    6. Impaired mobility and activities of daily living    7. Tobacco use disorder        SNR/TF LMBR/SAC Injection  Procedure: transforaminal epidural    Laterality: left   on 07/16/2020 11:05 AM  Location: lumbar - L4-5 and L5-S1      Consent:   Consent obtained: written  Consent given by: patient  Risks discussed: bleeding, bruising, infection, nerve damage, no change or worsening in pain, reaction to medication and weakness    Discussed with patient the purpose of the treatment/procedure, other ways of treating my condition, including no treatment/ procedure and the risks and benefits of the alternatives. Patient has decided to proceed with treatment/procedure.        Universal Protocol:  Imaging studies: imaging studies available  Site marked: the operative site was marked  Patient identity confirmed: Patient identify confirmed verbally with patient.        Time out: Immediately prior to procedure a time out was called to verify the correct patient, procedure, equipment, support staff and site/side marked as required      Procedures Details:   Indications: pain   Prep: chlorhexidine  Patient position: prone  Estimated Blood Loss: minimal  Specimens: none  Number of Joints: 2  Approach: left paramedian Needle and Epidural Catheter: quincke  Needle size: 25 G  Patient tolerance: Patient tolerated the procedure well with no immediate complications. Pressure was applied, and hemostasis was accomplished.  Comments: ANESTHESIA PROCEDURE REPORT    Lumbar Transforaminal Epidural Injection    Procedure Title(s):    1. Lumbar transforaminal epidural injection   2. Intraoperative fluoroscopy     Attending Surgeon: Mikal Plane, DO    Anesthesia: Local                       Anxiolysis No           Conscious sedation No    Indications:Patient presents with a diagnosis of radiculopathy. The patient's history and physical exam were reviewed. The risks, benefits and alternatives to the procedure were discussed, and all questions were answered to the patient's satisfaction. The patient agreed to proceed, and written informed consent was obtained.     Procedure in Detail: IV was started? No    The patient was brought into the procedure room and placed in the prone position on the fluoroscopy table. Standard monitors were placed, and vital signs were observed throughout the procedure. The area of the lumbar spine was prepped with Chloroprep and draped in a sterile manner. The LEFT L4-L5 vertebral bodies were identified with AP fluoroscopy. An oblique view was obtained to better visualize the inferior  junction of the pedicle and transverse process. The 6 o'clock position of the pedicle was marked and identified. The skin and subcutaneous tissues in the area were anesthetized with 1% lidocaine. A 25-gauge, 3.5 inch needle was directed toward the targeted point under fluoroscopy until bone was contacted. The needle was then walked inferiorly until the neural foramen was entered. A lateral fluoroscopic view was then used to place the needle tip at the 10 o'clock position of the foramen.      The LEFT L5-S1 vertebral bodies were identified with AP fluoroscopy. An oblique view was obtained to better visualize the inferior junction of the pedicle and transverse process. The 6 o'clock position of the pedicle was marked and identified. The skin and subcutaneous tissues in the area were anesthetized with 1% lidocaine. A 25-gauge, 3.5 inch needle was directed toward the targeted point under fluoroscopy until bone was contacted. The needle was then walked inferiorly until the neural foramen was entered. A lateral fluoroscopic view was then used to place the needle tip at the 10 o'clock position of the foramen.       Negative aspiration was confirmed, and 1ml of contrast was injected at each level. Appropriate neurograms were observed under live AP fluoroscopy with no noted vascular or intrathecal uptake. Then, after negative aspiration, a solution consisting of 2 mL 1% lidocaine and 5 (steroid) mg dexamethasone was easily injected at each level. The needles were removed with a 1% lidocaine flush. The patient's back was cleaned and a bandage was placed over the needle insertion points.    The same procedure was performed on the opposite side? No    Disposition: The patient tolerated the procedure well, and there were no apparent complications. Vital signs remained stable througtout the procedure. The patient was taken to the recovery area where discharge instructions for the procedure were given.     Estimated Blood Loss: minimal    Specimens: none    Complications: none            Estimated blood loss: none or minimal  Specimens: none  Patient tolerated the procedure well with no immediate complications. Pressure was applied, and hemostasis was accomplished.

## 2020-07-16 NOTE — Discharge Instructions - Supplementary Instructions
..  GENERAL POST PROCEDURE INSTRUCTIONS  Physician: _________________________________  Procedure Completed Today:  o Joint Injection (hip, knee, shoulder)  o Cervical Epidural Steroid Injection  o Cervical Transforaminal Steroid Injection  o Trigger Point Injection  o Caudal Epidural Steroid Injection  o Pudendal Nerve Block  o Other _____________________ o Thoracic Epidural Steroid Injection  o Lumbar Epidural Steroid Injection  o Lumbar Transforaminal Steroid Injection  o Facet Joint Injection  o Celiac Nerve Block  o Sacrococcygeal  o Sacroiliac Joint Injection   Important information following your procedure today:  o You may drive today     o If you had sedation, you may NOT drive today  - Rest at home for the next 6 hours.  You may then begin to resume your normal activities.  - DO NOT drive any vehicle, operate any power tools, drink alcohol, make any major decisions, or sign any legal documents for the next 12 hours.  1. Pain relief may not be immediate. It is possible you may even experience an increase in pain during the first 24-48 hours followed by a gradual decrease of your pain.  2. Though the procedure is generally safe, and complications are rare, we do ask that you be aware of any of the following:  ? Any swelling, persistent redness, new bleeding or drainage from the site of the injection.  ? You should not experience a severe headache.  ? You should not run a fever over 101oF.  ? New onset of sharp, severe back and or neck pain.  ? New onset of upper or lower extremity numbness or weakness.  ? New difficulty controlling bowel or bladder function after injection.  ? New shortness of breath.  ** If any of these occur, please call to report this occurrence to a nurse at [phone]. If you are calling after 4:00 p.m. or on weekends or holidays, please call [phone] and ask to have the resident physician on call for the physician paged or go to your local emergency room.  3. You may experience soreness at the injection site. Ice can be applied at 20-minute intervals for the first 24 hours. The following day you may alternate ice with heat if you are experiencing muscle tightness, otherwise continue with ice. Ice works best at decreasing pain. Avoid application of direct heat, hot showers or hot tubs today.  4. Avoid strenuous activity today. You many resume your regular activities and exercise tomorrow.  5. Patients with diabetes may see an elevation in blood sugars for 7-10 days after the injection. It is important to pay close attention to your diet, check your blood sugars daily and report extreme elevations to the physician that manages your diabetes.  6. Patients taking daily blood thinners can resume their regular dose this evening.  7. It is important that you take all medications ordered by your pain physician. Taking medications as ordered is an important part of your pain care plan. If you cannot continue the medication plan, please notify the physician.    Possible side effects to steroids that may occur:  ? Flushing or redness of the face  ? Irritability  ? Fluid retention  ? Change in women's menses  ? Minor headache    If you are unable to keep your upcoming appointment, please notify the Spine Center scheduler at [phone] at least 24 hours in advance. If you have questions for the surgery center, call [facility] at [phone].

## 2020-07-16 NOTE — Progress Notes
INTERVENTIONAL SPINE PROCEDURE HISTORY AND PHYSICAL    Chief Complaint: No chief complaint on file.      HISTORY OF PRESENT ILLNESS:   The patient presents to today's scheduled appointment for lumbar TFESI as a referral from [facility] after being seen on 07/10/20.  Patient denies any recent fevers, chills, or night sweats.  Patient denies any allergies to contrast, iodine, or shellfish.  Patient denies any recent hospitalizations. Patient is not currently taking antibiotics or anticoagulation medications.      ROS:   A 10 point review of systems was performed and found to be negative other than that stated above in the HPI.    Past Medical History:  Medical History:   Diagnosis Date   ? Hypercholesterolemia 03/28/2010   ? Hypertension 03/28/2010   ? Tests ordered 03/28/2010       Family History:  Family History   Problem Relation Age of Onset   ? Heart Surgery Father         Valve replacement   ? Heart Disease Father        Social History:  Lives in [place] [address]    Social History     Socioeconomic History   ? Marital status: Married     Spouse name: Not on file   ? Number of children: Not on file   ? Years of education: Not on file   ? Highest education level: Not on file   Occupational History   ? Not on file   Tobacco Use   ? Smoking status: Current Every Day Smoker     Packs/day: 0.25     Types: Cigarettes   ? Smokeless tobacco: Never Used   Substance and Sexual Activity   ? Alcohol use: No   ? Drug use: No   ? Sexual activity: Not on file   Other Topics Concern   ? Not on file   Social History Narrative   ? Not on file       Allergies:  No Known Allergies    Medications:    Current Outpatient Medications:   ?  aspirin EC 81 mg tablet, Take 81 mg by mouth daily. Take with food., Disp: , Rfl:   ?  CALCIUM CARBONATE/VITAMIN D3 (CALCIUM 600 + D PO), Take 1 Tab by mouth daily., Disp: , Rfl:   ?  cetirizine (ZYRTEC) 10 mg PO tablet, Take 10 mg by mouth daily., Disp: , Rfl:   ?  diazepam (VALIUM) 2 mg PO tablet, Take 2 mg by mouth every 6 hours as needed., Disp: , Rfl:   ?  ezetimibe (ZETIA) 10 mg tablet, Take one tablet by mouth daily., Disp: 90 tablet, Rfl: 3  ?  fenofibrate nanocrystallized (TRICOR) 145 mg tablet, Take one tablet by mouth daily. Take with food., Disp: 90 tablet, Rfl: 3  ?  hydroCHLOROthiazide (HYDRODIURIL) 25 mg tablet, TAKE 1 TABLET BY MOUTH EVERY DAY IN THE MORNING, Disp: 90 tablet, Rfl: 3  ?  icosapent ethyl 1 gram cap, Take two capsules by mouth twice daily., Disp: 360 capsule, Rfl: 3  ?  losartan (COZAAR) 100 mg tablet, TAKE 1 TABLET BY MOUTH EVERY DAY, Disp: 90 tablet, Rfl: 3  ?  lutein 20 mg tab, Take 1 tablet by mouth daily., Disp: , Rfl:   ?  paroxetine (PAXIL) 10 mg PO tablet, Take 10 mg by mouth daily., Disp: , Rfl:   ?  rosuvastatin (CRESTOR) 40 mg tablet, TAKE 1 TABLET BY MOUTH  EVERY DAY, Disp: 90 tablet, Rfl: 3  ?  spironolactone (ALDACTONE) 25 mg tablet, Take one tablet by mouth daily. Take with food., Disp: 90 tablet, Rfl: 3    Physical examination:   There were no vitals taken for this visit.     General: Patient appears stated age, in no acute distress  HEENT: Normocephalic, atraumatic  Neck: No thyroidmegaly  Cardiovascular: Well perfused  Pulmonary: Unlabored respirations  Extremities: No cyanosis, clubbing, or edema  Skin: Warm and dry  Psychiatric:  Appropriate mood and affect  Musculoskeletal: No atrophy  Neurologic: Antigravity strength in all extremities      Last Cr and LFT's:  Creatinine   Date Value Ref Range Status   09/12/2019 0.85  Final     AST (SGOT)   Date Value Ref Range Status   08/15/2019 22  Final     ALT (SGPT)   Date Value Ref Range Status   08/15/2019 22  Final     Alk Phosphatase   Date Value Ref Range Status   08/15/2019 55  Final     Total Bilirubin   Date Value Ref Range Status   08/15/2019 0.63  Final            ASSESSMENT:  1. DDD (degenerative disc disease), lumbar    2. Chronic midline low back pain with sciatica, sciatica laterality unspecified    3. Foraminal stenosis of lumbar region    4. Spinal stenosis of lumbar region with radiculopathy    5. Impaired mobility and activities of daily living    6. Tobacco use disorder      Sharon Cooley is a 79 y.o. female who  has a past medical history of Hypercholesterolemia (03/28/2010), Hypertension (03/28/2010), and Tests ordered (03/28/2010). who presents for evaluation of pain.    PLAN:  LEFT L4-5 and L5-S1 TFESI

## 2020-08-16 ENCOUNTER — Encounter: Admit: 2020-08-16 | Discharge: 2020-08-16 | Payer: MEDICARE

## 2020-08-16 MED ORDER — SPIRONOLACTONE 25 MG PO TAB
ORAL_TABLET | Freq: Every day | ORAL | 3 refills | 90.00000 days | Status: AC
Start: 2020-08-16 — End: ?

## 2020-08-21 ENCOUNTER — Ambulatory Visit: Admit: 2020-08-21 | Discharge: 2020-08-21 | Payer: MEDICARE

## 2020-08-21 ENCOUNTER — Encounter: Admit: 2020-08-21 | Discharge: 2020-08-21 | Payer: MEDICARE

## 2020-08-21 DIAGNOSIS — M5440 Lumbago with sciatica, unspecified side: Secondary | ICD-10-CM

## 2020-08-21 DIAGNOSIS — M545 Low back pain: Secondary | ICD-10-CM

## 2020-08-21 DIAGNOSIS — M5136 Other intervertebral disc degeneration, lumbar region: Secondary | ICD-10-CM

## 2020-08-21 DIAGNOSIS — E559 Vitamin D deficiency, unspecified: Secondary | ICD-10-CM

## 2020-08-21 DIAGNOSIS — Z0189 Encounter for other specified special examinations: Secondary | ICD-10-CM

## 2020-08-21 DIAGNOSIS — Z7409 Other reduced mobility: Secondary | ICD-10-CM

## 2020-08-21 DIAGNOSIS — E213 Hyperparathyroidism, unspecified: Secondary | ICD-10-CM

## 2020-08-21 DIAGNOSIS — E7800 Pure hypercholesterolemia, unspecified: Secondary | ICD-10-CM

## 2020-08-21 DIAGNOSIS — F17200 Nicotine dependence, unspecified, uncomplicated: Secondary | ICD-10-CM

## 2020-08-21 DIAGNOSIS — M48061 Spinal stenosis, lumbar region without neurogenic claudication: Secondary | ICD-10-CM

## 2020-08-21 DIAGNOSIS — I10 Essential (primary) hypertension: Secondary | ICD-10-CM

## 2020-08-21 DIAGNOSIS — M810 Age-related osteoporosis without current pathological fracture: Secondary | ICD-10-CM

## 2020-08-21 NOTE — Patient Instructions
It was a pleasure seeing you in clinic today.  Please don't hesitate to call if you have any questions.      Nazia Rhines BSN, RN, CNOR  Clinical Nurse Coordinator  Dr. Brandon Carlson  [facility]  [facility]  [address]  [place], [place] [address]  [email or web address]  Phone: [phone]  Fax:  [phone]  Scheduling [phone]

## 2020-08-21 NOTE — Progress Notes
[facility]  Follow - Up Visit  Subjective     REASON FOR VISIT   Pain of the Lower Back    SUBJECTIVE     She is here 1 month s/p lumbar injection on the left at L4-5 and L5-S1 with Dr. Sandi Mealy. She reports the injection did not help her pain.  Her main complaint is left leg pain with walking after the 1st couple steps, and with any stairs and any incline. Her pain starts in the left glute and goes down the leg into the thigh to the ankle. Walking around the house in the yard is extremely difficult.  She continues to use nicotine. She has not had a bone density test in several years.         ROS: Review of Systems   Musculoskeletal: Positive for back pain.   All other systems reviewed and are negative.    A 10-point ROS was performed and negative.    PHYSICAL EXAM   Blood pressure 126/51, pulse 62, resp. rate 16, height 154.9 cm (61), weight 61.2 kg (135 lb), SpO2 99 %.  Body mass index is 25.51 kg/m?Marland Kitchen     Pain Score: Two    Constitutional: Alert, NAD  Head: Atraumatic  Eyes: EOMI  Respiratory: Unlabored breathing  Cardiovascular: Regular rate  Skin: No rashes or open wounds appreciated on back  Musculoskeletal: Strength stable  Neurologic: Sensation stable    Unchanged from previous exam.    RADIOGRAPHS     No new imaging.     ASSESSMENT / PLAN     Sharon Cooley is a 78 y.o. female with     1. Spinal stenosis of lumbar region with radiculopathy  BONE DENSITY SPINE/HIP   2. Impaired mobility and activities of daily living  BONE DENSITY SPINE/HIP   3. Tobacco use disorder  BONE DENSITY SPINE/HIP   4. Foraminal stenosis of lumbar region  BONE DENSITY SPINE/HIP   5. Chronic midline low back pain with sciatica, sciatica laterality unspecified  BONE DENSITY SPINE/HIP   6. DDD (degenerative disc disease), lumbar  BONE DENSITY SPINE/HIP   7. Lumbar pain  BONE DENSITY SPINE/HIP   8. Vitamin D deficiency  25-OH VITAMIN D (D2 + D3)    BONE DENSITY SPINE/HIP   9. Hyperparathyroidism (HCC) PARATHYROID HORMONE    BONE DENSITY SPINE/HIP   10. Age-related osteoporosis without current pathological fracture  BONE DENSITY SPINE/HIP    CANCELED: BONE DENSITY SPINE/HIP         Reviewed patient's imaging with her and how it correlates with her symptoms. Its unfortunate the injection did not help as much as we would have liked. We can continue to work on a surgical work up. Bone density and labs: PTH and Vit D ordered. ?The patient is currently not a surgical candidate given her current nicotine use. ?The patient would need to be completely nicotine free for 30 days prior to pursuing any type of operation. ?We would check patient's cotinine level prior to scheduling a surgery date. If she has osteoporosis she would need bone health optimization for 3 months piror to any type of spine surgical fusion. Meanwhile, I would continue non-operative treatments.   She would like to get her bone density and labs done in [place]. Order for these items provided to patient. Will follow up via phone re: bone density results and labs and schedule as appropriate.  Otherwise, follow up can be scheduled once she is nicotine free  for 30 days.      Malachy Moan, APRN, FNP-C   Nurse Practitioner-Spine Surgery    [place] B. Lisette Grinder, MD, MPH  Spinal Surgery  [place]. [facility] P[facility] MD, [facility]  Nurse: Laverle Patter, BSN, RN, CNOR   [phone]  -  [email or web address]

## 2020-08-25 ENCOUNTER — Encounter: Admit: 2020-08-25 | Discharge: 2020-08-25 | Payer: MEDICARE

## 2020-08-25 MED ORDER — FENOFIBRATE NANOCRYSTALLIZED 145 MG PO TAB
145 mg | ORAL_TABLET | Freq: Every day | ORAL | 3 refills
Start: 2020-08-25 — End: ?

## 2020-08-27 ENCOUNTER — Encounter: Admit: 2020-08-27 | Discharge: 2020-08-27 | Payer: MEDICARE

## 2020-08-27 NOTE — Telephone Encounter
Patient lvm asking to fax bone density and lab orders to [facility] scheduling at [phone].  Orders faxed as requested.

## 2020-09-07 ENCOUNTER — Encounter: Admit: 2020-09-07 | Discharge: 2020-09-07 | Payer: MEDICARE

## 2020-09-07 DIAGNOSIS — M545 Low back pain: Secondary | ICD-10-CM

## 2020-09-07 DIAGNOSIS — F17200 Nicotine dependence, unspecified, uncomplicated: Secondary | ICD-10-CM

## 2020-09-07 DIAGNOSIS — M810 Age-related osteoporosis without current pathological fracture: Secondary | ICD-10-CM

## 2020-09-07 DIAGNOSIS — M5440 Lumbago with sciatica, unspecified side: Secondary | ICD-10-CM

## 2020-09-07 DIAGNOSIS — Z7409 Other reduced mobility: Secondary | ICD-10-CM

## 2020-09-07 DIAGNOSIS — M48061 Spinal stenosis, lumbar region without neurogenic claudication: Secondary | ICD-10-CM

## 2020-09-07 DIAGNOSIS — E559 Vitamin D deficiency, unspecified: Secondary | ICD-10-CM

## 2020-09-07 DIAGNOSIS — M5136 Other intervertebral disc degeneration, lumbar region: Secondary | ICD-10-CM

## 2020-09-07 DIAGNOSIS — E213 Hyperparathyroidism, unspecified: Secondary | ICD-10-CM

## 2020-09-14 ENCOUNTER — Encounter: Admit: 2020-09-14 | Discharge: 2020-09-14 | Payer: MEDICARE

## 2020-09-14 NOTE — Telephone Encounter
Patient called asking if Dr. Lisette Grinder has the results of her lab work and bone density.  Informed patient that bone density suggests osteopenia.  Will discuss with Dr. Lisette Grinder to determine plan and will call patient with update.

## 2020-09-15 ENCOUNTER — Encounter: Admit: 2020-09-15 | Discharge: 2020-09-15 | Payer: MEDICARE

## 2020-09-15 MED ORDER — EZETIMIBE 10 MG PO TAB
ORAL_TABLET | Freq: Every day | 3 refills | Status: AC
Start: 2020-09-15 — End: ?

## 2020-09-19 ENCOUNTER — Encounter: Admit: 2020-09-19 | Discharge: 2020-09-19 | Payer: MEDICARE

## 2020-09-19 NOTE — Telephone Encounter
Lvm with patient informing her Dr. Lisette Grinder reviewed bone density and would like patient to see the NP in the spine center osteoporosis clinic to assess and start on Tymlos.  Asked patient to return call.

## 2020-09-20 ENCOUNTER — Encounter: Admit: 2020-09-20 | Discharge: 2020-09-20 | Payer: MEDICARE

## 2020-09-20 DIAGNOSIS — E559 Vitamin D deficiency, unspecified: Secondary | ICD-10-CM

## 2020-09-20 DIAGNOSIS — E213 Hyperparathyroidism, unspecified: Secondary | ICD-10-CM

## 2020-09-20 DIAGNOSIS — M5136 Other intervertebral disc degeneration, lumbar region: Secondary | ICD-10-CM

## 2020-09-20 DIAGNOSIS — M818 Other osteoporosis without current pathological fracture: Secondary | ICD-10-CM

## 2020-09-20 DIAGNOSIS — M48061 Spinal stenosis, lumbar region without neurogenic claudication: Secondary | ICD-10-CM

## 2020-09-20 DIAGNOSIS — M810 Age-related osteoporosis without current pathological fracture: Secondary | ICD-10-CM

## 2020-09-20 DIAGNOSIS — M545 Low back pain: Secondary | ICD-10-CM

## 2020-09-20 NOTE — Telephone Encounter
Patient returning call.  Appt made for patient to see Edmonia James for [facility] or Forteo evaluation.  Orders placed for CBC, CMP, and TSH.  Patient would like to have lab work done at [facility] in [place].  Orders faxed to [phone].

## 2020-09-21 ENCOUNTER — Encounter: Admit: 2020-09-21 | Discharge: 2020-09-21 | Payer: MEDICARE

## 2020-09-21 DIAGNOSIS — M48061 Spinal stenosis, lumbar region without neurogenic claudication: Secondary | ICD-10-CM

## 2020-09-21 DIAGNOSIS — M5136 Other intervertebral disc degeneration, lumbar region: Secondary | ICD-10-CM

## 2020-09-21 DIAGNOSIS — M810 Age-related osteoporosis without current pathological fracture: Secondary | ICD-10-CM

## 2020-09-21 DIAGNOSIS — M818 Other osteoporosis without current pathological fracture: Secondary | ICD-10-CM

## 2020-09-24 ENCOUNTER — Encounter: Admit: 2020-09-24 | Discharge: 2020-09-24 | Payer: MEDICARE

## 2020-09-24 NOTE — Telephone Encounter
Patient called asking if lab results have been received since she has appt on 9/28 with [facility].  Returned call informing her all lab results were received and scanned to her chart.

## 2020-09-25 ENCOUNTER — Encounter: Admit: 2020-09-25 | Discharge: 2020-09-25 | Payer: MEDICARE

## 2020-09-25 ENCOUNTER — Ambulatory Visit: Admit: 2020-09-25 | Discharge: 2020-09-25 | Payer: MEDICARE

## 2020-09-25 DIAGNOSIS — I10 Essential (primary) hypertension: Secondary | ICD-10-CM

## 2020-09-25 DIAGNOSIS — Z0189 Encounter for other specified special examinations: Secondary | ICD-10-CM

## 2020-09-25 DIAGNOSIS — E7800 Pure hypercholesterolemia, unspecified: Secondary | ICD-10-CM

## 2020-09-25 MED ORDER — PEN NEEDLE, DIABETIC 31 GAUGE X 3/16" MISC NDLE
0 refills | Status: CN
Start: 2020-09-25 — End: ?

## 2020-09-25 MED ORDER — TYMLOS 80 MCG (3,120 MCG/1.56 ML) SC PNIJ
80 ug | Freq: Every day | SUBCUTANEOUS | 0 refills | Status: CN
Start: 2020-09-25 — End: ?

## 2020-09-26 ENCOUNTER — Encounter: Admit: 2020-09-26 | Discharge: 2020-09-26 | Payer: MEDICARE

## 2020-09-27 ENCOUNTER — Encounter: Admit: 2020-09-27 | Discharge: 2020-09-27 | Payer: MEDICARE

## 2020-09-27 NOTE — Progress Notes
Called(Emailed )Pati Gallo nurse to provider.Tymlos is a non formulary covered product. Forteo and Prolia are covered products upon review by the Pharmacy Insurance  Benefits team.     Vennie Homans Key: [id number] ? PA Case ID: [id number]               Gaston Islam, CPhT  Pharmacy Patient Advocate, Specialty Pharmacy  [phone]

## 2020-09-28 ENCOUNTER — Encounter: Admit: 2020-09-28 | Discharge: 2020-09-28 | Payer: MEDICARE

## 2020-09-28 NOTE — Telephone Encounter
Patient's insurance will not cover tymlos. RX sent for forteo.

## 2020-10-01 ENCOUNTER — Encounter: Admit: 2020-10-01 | Discharge: 2020-10-01 | Payer: MEDICARE

## 2020-10-01 NOTE — Progress Notes
The Prior Authorization for Sharren Bridge has been submitted for Nicolette Bang via Cover My Meds.  Will continue to follow.    [facility]  Pharmacy Patient Advocate  [phone]

## 2020-10-02 ENCOUNTER — Encounter: Admit: 2020-10-02 | Discharge: 2020-10-02 | Payer: MEDICARE

## 2020-10-02 NOTE — Progress Notes
The Prior Authorization for Sharren Bridge was approved for KEYRI SALBERG from 10/02/20 to 12/28/20.  The copay is $991.65.  The PA authorization number is [id number].    PPA looking into available assistance.    Wallene Dales  Pharmacy Patient Advocate  [phone]

## 2020-10-02 NOTE — Progress Notes
The Prior Authorization for Sharren Bridge has been submitted for Nicolette Bang via Cover My Meds.  Will continue to follow.    Wallene Dales  Pharmacy Patient Advocate  [phone]

## 2020-10-03 ENCOUNTER — Encounter: Admit: 2020-10-03 | Discharge: 2020-10-03 | Payer: MEDICARE

## 2020-10-03 NOTE — Progress Notes
Called [employer] to discuss options for funding regarding their medication: Forteo. Her  monthly out of pocket copay is $992.00. I have provided Samara Deist with my email    [email or web address]  Or my fax  [phone] to submit documents for income verification.        Gaston Islam, CPhT  Pharmacy Patient Advocate, Specialty Pharmacy  [phone]

## 2020-10-08 ENCOUNTER — Encounter: Admit: 2020-10-08 | Discharge: 2020-10-08 | Payer: MEDICARE

## 2020-10-08 NOTE — Telephone Encounter
Patient lvm asking if there were some exercises she could do before surgery for leg and core strengthening.  Returned call informing patient [email or web address] has a list of exercises, water aerobics is good for back and core strengthening.  Patient can go to PT requesting a home exercise program. A referral can be placed to PT if patient would like to pursue.

## 2020-10-10 ENCOUNTER — Encounter: Admit: 2020-10-10 | Discharge: 2020-10-10 | Payer: MEDICARE

## 2020-10-10 DIAGNOSIS — Z7409 Other reduced mobility: Secondary | ICD-10-CM

## 2020-10-10 DIAGNOSIS — M810 Age-related osteoporosis without current pathological fracture: Secondary | ICD-10-CM

## 2020-10-10 DIAGNOSIS — M48061 Spinal stenosis, lumbar region without neurogenic claudication: Secondary | ICD-10-CM

## 2020-10-10 DIAGNOSIS — M5136 Other intervertebral disc degeneration, lumbar region: Secondary | ICD-10-CM

## 2020-10-10 NOTE — Telephone Encounter
Patient lvm stating she would like some formal PT to help strengthen her core and leg muscles.  Requested order be sent to [facility] in [place]. PT order faxed to [phone].  Returned call informing patient order has been faxed as requested.

## 2020-10-12 ENCOUNTER — Encounter: Admit: 2020-10-12 | Discharge: 2020-10-12 | Payer: MEDICARE

## 2020-10-12 NOTE — Progress Notes
The medication assistance application for Forteo  has been denied for Sharon Cooley due to the following:    Over income    The patient and provider have been notified that Sharon Cooley does not qualify for medication assistance at this time.  The current copay for is $961.00.  Contacted Sharon Cooley to discuss option of speaking with the provider to determine if she could be prescribed Prolia. Insurance states Tymlos is non-formulary medication. Patient is aware and will contact her provider, Sharon Cooley to discuss medication options.   Contacted Pati Gallo by Teams Message to inform her and see if the medication Prolia would be an option for the patient.    [facility]  Pharmacy Patient Advocate  [phone]

## 2020-10-15 ENCOUNTER — Encounter: Admit: 2020-10-15 | Discharge: 2020-10-15 | Payer: MEDICARE

## 2020-10-17 ENCOUNTER — Encounter: Admit: 2020-10-17 | Discharge: 2020-10-17 | Payer: MEDICARE

## 2020-10-17 NOTE — Progress Notes
Contacted patients nurse, Pati Gallo, to discuss prescribing Prolia and/or possible samples. Patient is over income for funding.   The specialty pharmacy team has requested this information and until it is received the specialty pharmacy will not be able to continue the process for specialty medication access.    Gaston Islam, CPhT  Pharmacy Patient Advocate, Specialty Pharmacy  [phone]

## 2020-10-19 ENCOUNTER — Encounter: Admit: 2020-10-19 | Discharge: 2020-10-19 | Payer: MEDICARE

## 2020-10-19 NOTE — Telephone Encounter
Patient LVM that she was not able to p/u any osteoporosis medications d/t insurance not covering them.  RN called her and let her know that Pati Gallo, RN, and Hall Busing, APRN, might have some options to find a solution to help.  Will route message to RN Alberteen Spindle to f/u with patient next week.  Patient demonstrates understanding and is agreeable to plan.

## 2020-10-22 ENCOUNTER — Encounter: Admit: 2020-10-22 | Discharge: 2020-10-22 | Payer: MEDICARE

## 2020-10-22 NOTE — Telephone Encounter
Patient will be given 3 mo supply of tymlos samples prior to and after her spine surgery. Patient's daughter and son will come to main campus on 10/23/20 to pick up 3 mo supply.

## 2020-10-22 NOTE — Progress Notes
I spoke with patients nurse, Pati Gallo the providers office will have samples for her medication Forteo. [employer]

## 2020-10-23 ENCOUNTER — Encounter: Admit: 2020-10-23 | Discharge: 2020-10-23 | Payer: MEDICARE

## 2020-10-23 NOTE — Telephone Encounter
Patient's daughter provided with 3 mo supply of tymlos samples and needles.

## 2020-10-31 ENCOUNTER — Encounter: Admit: 2020-10-31 | Discharge: 2020-10-31 | Payer: MEDICARE

## 2020-11-02 ENCOUNTER — Encounter: Admit: 2020-11-02 | Discharge: 2020-11-02 | Payer: MEDICARE

## 2020-11-02 NOTE — Telephone Encounter
Patient lvm asking to schedule surgery.  She has appointment with Dr. Lisette Grinder in January.  Returned call, lvm informing patient she hasn't been seen since August so will see patient at appt in January, get a nicotine level drawn and discuss surgery dates at that time.

## 2020-11-12 ENCOUNTER — Encounter: Admit: 2020-11-12 | Discharge: 2020-11-12 | Payer: MEDICARE

## 2020-11-12 DIAGNOSIS — E782 Mixed hyperlipidemia: Secondary | ICD-10-CM

## 2020-11-14 ENCOUNTER — Encounter: Admit: 2020-11-14 | Discharge: 2020-11-14 | Payer: MEDICARE

## 2020-11-14 DIAGNOSIS — E782 Mixed hyperlipidemia: Secondary | ICD-10-CM

## 2020-11-14 LAB — LIVER FUNCTION PANEL
Lab: 0.2
Lab: 0.5
Lab: 20
Lab: 4.3
Lab: 59

## 2020-11-14 LAB — LIPID PROFILE
Lab: 132
Lab: 134
Lab: 26
Lab: 3
Lab: 42
Lab: 66

## 2020-11-20 ENCOUNTER — Encounter: Admit: 2020-11-20 | Discharge: 2020-11-20 | Payer: MEDICARE

## 2020-11-20 DIAGNOSIS — Z0189 Encounter for other specified special examinations: Secondary | ICD-10-CM

## 2020-11-20 DIAGNOSIS — E7800 Pure hypercholesterolemia, unspecified: Secondary | ICD-10-CM

## 2020-11-20 DIAGNOSIS — I10 Essential (primary) hypertension: Secondary | ICD-10-CM

## 2020-12-07 ENCOUNTER — Encounter: Admit: 2020-12-07 | Discharge: 2020-12-07 | Payer: MEDICARE

## 2020-12-07 DIAGNOSIS — M810 Age-related osteoporosis without current pathological fracture: Secondary | ICD-10-CM

## 2020-12-07 NOTE — Telephone Encounter
RN informed patient that she is due for repeat CMP. Patient will have drawn at PCP office. RN faxed order to [phone].

## 2020-12-13 ENCOUNTER — Encounter: Admit: 2020-12-13 | Discharge: 2020-12-13 | Payer: MEDICARE

## 2020-12-13 NOTE — Progress Notes
Received vm from [facility] asking for CMP order to be faxed to [phone].  Order faxed as requested.

## 2021-01-02 ENCOUNTER — Encounter: Admit: 2021-01-02 | Discharge: 2021-01-02 | Payer: MEDICARE

## 2021-01-02 DIAGNOSIS — M810 Age-related osteoporosis without current pathological fracture: Secondary | ICD-10-CM

## 2021-01-04 ENCOUNTER — Encounter: Admit: 2021-01-04 | Discharge: 2021-01-04 | Payer: MEDICARE

## 2021-01-04 DIAGNOSIS — M48061 Spinal stenosis, lumbar region without neurogenic claudication: Secondary | ICD-10-CM

## 2021-01-04 DIAGNOSIS — M545 Lumbar pain: Secondary | ICD-10-CM

## 2021-01-04 DIAGNOSIS — M5136 Other intervertebral disc degeneration, lumbar region: Secondary | ICD-10-CM

## 2021-01-15 ENCOUNTER — Encounter: Admit: 2021-01-15 | Discharge: 2021-01-15 | Payer: MEDICARE

## 2021-01-15 ENCOUNTER — Ambulatory Visit: Admit: 2021-01-15 | Discharge: 2021-01-15 | Payer: MEDICARE

## 2021-01-15 DIAGNOSIS — I10 Essential (primary) hypertension: Secondary | ICD-10-CM

## 2021-01-15 DIAGNOSIS — M545 Lumbar pain: Secondary | ICD-10-CM

## 2021-01-15 DIAGNOSIS — M48061 Spinal stenosis, lumbar region without neurogenic claudication: Secondary | ICD-10-CM

## 2021-01-15 DIAGNOSIS — M5136 Other intervertebral disc degeneration, lumbar region: Secondary | ICD-10-CM

## 2021-01-15 DIAGNOSIS — Z720 Tobacco use: Secondary | ICD-10-CM

## 2021-01-15 DIAGNOSIS — F1920 Other psychoactive substance dependence, uncomplicated: Secondary | ICD-10-CM

## 2021-01-15 DIAGNOSIS — F17200 Nicotine dependence, unspecified, uncomplicated: Secondary | ICD-10-CM

## 2021-01-15 DIAGNOSIS — E119 Type 2 diabetes mellitus without complications: Secondary | ICD-10-CM

## 2021-01-15 DIAGNOSIS — M85851 Other specified disorders of bone density and structure, right thigh: Secondary | ICD-10-CM

## 2021-01-15 DIAGNOSIS — Z7409 Other reduced mobility: Secondary | ICD-10-CM

## 2021-01-15 DIAGNOSIS — E7800 Pure hypercholesterolemia, unspecified: Secondary | ICD-10-CM

## 2021-01-15 DIAGNOSIS — Z0189 Encounter for other specified special examinations: Secondary | ICD-10-CM

## 2021-01-15 LAB — HEMOGLOBIN A1C: Lab: 7 % — ABNORMAL HIGH (ref 4.0–6.0)

## 2021-01-15 NOTE — Patient Instructions
It was a pleasure seeing you in clinic today.  Please don't hesitate to call if you have any questions.      Kayley Zeiders BSN, RN, CNOR  Clinical Nurse Coordinator  Dr. Brandon Carlson  [facility]  [facility]  [address]  [place], [place] [address]  [email or web address]  Phone: [phone]  Fax:  [phone]  Scheduling [phone]

## 2021-01-15 NOTE — Progress Notes
Sharon A. Clydene Pugh, MD [facility]  Follow - Up Visit  Subjective     REASON FOR VISIT   Pain of the Lower Back, Pain of the Left Hip, and Pain of the Left Leg    SUBJECTIVE     Sharon Cooley returns today for repeat evaluation.  She was last seen in August of 2021.  She is nicotine free.  She quit smoking September 28, 2020.  She continues to complain of low back, buttock, and bilateral leg pain.  Left side is worse than the right.  Worse with walking.  Does alright standing.  She would like to proceed with surgery.         ROS: Review of Systems   Musculoskeletal: Positive for back pain.   All other systems reviewed and are negative.    A 10-point ROS was performed and negative.    PHYSICAL EXAM   Blood pressure (!) 160/52, pulse 55, resp. rate 16, height 154.9 cm (61), weight 60.8 kg (134 lb), SpO2 100 %.  Body mass index is 25.32 kg/m?Marland Kitchen     Pain Score: Seven    Constitutional: Alert, NAD  Head: Atraumatic  Eyes: EOMI  Respiratory: Unlabored breathing  Cardiovascular: Regular rate  Skin: No rashes or open wounds appreciated on back  Musculoskeletal: Strength stable  Neurologic: Sensation stable    Unchanged.    RADIOGRAPHS     Exam: L SPINE AP & LATERAL     CLINICAL INDICATION: 79 years, Low back pain 6 weeks or more ?     COMPARISON: 07/10/2020 and 04/30/2020, MR L-spine 06/01/2020.     IMPRESSION       1. ?Minimal right curvature of the lumbar spine is unchanged. No   compressions or listhesis.     2. ?Severe multilevel degenerative disc disease with moderate to severe   endplate sclerosis is noted, most severe at L2-3 and L4-5 and to a lesser   extent at T12-L1 similar to the previous exams. Severe disc space   narrowing at L5-S1. Moderate anterior spurring at L2-3. Large right   lateral osteophyte formation at L2-3, with mild left lateral osteophyte   formation at L2-3.Marland Kitchen Bilateral lateral osteophyte formation at L4-5.       ?Finalized by Shanna Cisco, M.D. on 01/15/2021 11:39 AM. Dictated by Shanna Cisco, M.D. on 01/15/2021 11:35 AM.        ASSESSMENT / PLAN     Sharon Cooley is a 80 y.o. female with:    1. Nicotine use  NICOTINE & COTININE LEVEL   2. DDD (degenerative disc disease), lumbar  CT L-SPINE WO CONTRAST   3. Spinal stenosis of lumbar region with radiculopathy  CT L-SPINE WO CONTRAST   4. Lumbar pain  CT L-SPINE WO CONTRAST   5. Foraminal stenosis of lumbar region  CT L-SPINE WO CONTRAST   6. Osteopenia of necks of both femurs  CT L-SPINE WO CONTRAST   7. Impaired mobility and activities of daily living  CT L-SPINE WO CONTRAST   8. Tobacco use disorder  CT L-SPINE WO CONTRAST   9. Type 2 diabetes mellitus without complication, without long-term current use of insulin (HCC)  CT L-SPINE WO CONTRAST    HEMOGLOBIN A1C   10. Other psychoactive substance dependence, uncomplicated (HCC)   NICOTINE & COTININE LEVEL       The patient is interested in pursuing surgery at this point.  She would need to have her A1C rechecked.  This would  need to be under 7.  We need to also check a cotinine level.  She additionally needs a CT scan to best delineate the bony anatomy in the areas of the planned surgery.  This will allow for safe pre-operative planning.  We need to get a CT scan with the correct protocol and slice thickness of 0.625 mm and 1 mm to utilize intra-operative CT fluoro merge for navigation.  We will plan to follow up with her after the labs and CT scan are completed to review and make final recommendations.  From a surgical standpoint, I think the patient is likely looking at a two level lumbar fusion L4-S1.         In the presence of Marcelline Deist, MD , I have taken down these notes, Mamie Laurel, Scribe. January 15, 2021 11:10 AM      Dwyane Luo. Lisette Grinder, MD, MPH  Spinal Surgery  Sharon Beach. Clydene Pugh MD, [facility]  Nurse: Laverle Patter, BSN, RN, CNOR   [phone]  -  [email or web address]

## 2021-02-14 ENCOUNTER — Encounter

## 2021-02-14 DIAGNOSIS — M5136 Other intervertebral disc degeneration, lumbar region: Secondary | ICD-10-CM

## 2021-02-14 DIAGNOSIS — E119 Type 2 diabetes mellitus without complications: Secondary | ICD-10-CM

## 2021-02-14 DIAGNOSIS — M48061 Spinal stenosis, lumbar region without neurogenic claudication: Secondary | ICD-10-CM

## 2021-02-14 DIAGNOSIS — M545 Lumbar pain: Secondary | ICD-10-CM

## 2021-02-14 DIAGNOSIS — M85851 Other specified disorders of bone density and structure, right thigh: Secondary | ICD-10-CM

## 2021-02-14 DIAGNOSIS — Z7409 Other reduced mobility: Secondary | ICD-10-CM

## 2021-02-14 DIAGNOSIS — F17200 Nicotine dependence, unspecified, uncomplicated: Secondary | ICD-10-CM

## 2021-02-19 ENCOUNTER — Encounter: Admit: 2021-02-19 | Discharge: 2021-02-19 | Payer: MEDICARE

## 2021-02-19 MED ORDER — ROSUVASTATIN 40 MG PO TAB
ORAL_TABLET | Freq: Every day | ORAL | 3 refills | 90.00000 days | Status: AC
Start: 2021-02-19 — End: ?

## 2021-02-19 NOTE — Telephone Encounter
Patient requesting another month of tymlos samples. RN told her she could come in on Friday for refill.

## 2021-02-21 ENCOUNTER — Encounter: Admit: 2021-02-21 | Discharge: 2021-02-21 | Payer: MEDICARE

## 2021-02-21 MED ORDER — LOSARTAN 100 MG PO TAB
100 mg | ORAL_TABLET | Freq: Every day | ORAL | 3 refills | 30.00000 days | Status: AC
Start: 2021-02-21 — End: ?

## 2021-02-28 ENCOUNTER — Encounter: Admit: 2021-02-28 | Discharge: 2021-02-28 | Payer: MEDICARE

## 2021-02-28 ENCOUNTER — Ambulatory Visit: Admit: 2021-02-28 | Discharge: 2021-02-28 | Payer: MEDICARE

## 2021-02-28 DIAGNOSIS — Z7409 Other reduced mobility: Secondary | ICD-10-CM

## 2021-02-28 DIAGNOSIS — E7800 Pure hypercholesterolemia, unspecified: Secondary | ICD-10-CM

## 2021-02-28 DIAGNOSIS — M85851 Other specified disorders of bone density and structure, right thigh: Secondary | ICD-10-CM

## 2021-02-28 DIAGNOSIS — M5136 Other intervertebral disc degeneration, lumbar region: Secondary | ICD-10-CM

## 2021-02-28 DIAGNOSIS — Z0189 Encounter for other specified special examinations: Secondary | ICD-10-CM

## 2021-02-28 DIAGNOSIS — M545 Lumbar pain: Secondary | ICD-10-CM

## 2021-02-28 DIAGNOSIS — I10 Essential (primary) hypertension: Secondary | ICD-10-CM

## 2021-02-28 DIAGNOSIS — M48061 Spinal stenosis, lumbar region without neurogenic claudication: Secondary | ICD-10-CM

## 2021-02-28 DIAGNOSIS — E119 Type 2 diabetes mellitus without complications: Secondary | ICD-10-CM

## 2021-02-28 NOTE — Progress Notes
Marc A. Clydene Pugh, MD [facility]  Follow - Up Visit  Subjective     REASON FOR VISIT   Pain of the Lower Back    SUBJECTIVE     Sharon Cooley returns today for repeat evaluation.  She was sent for a CT scan of her lumbar spine.  We are reviewing this study today.  She complains of mostly bilateral hip and left leg pain.  Worse with standing or walking and better when she lays down or sits.  She does not have much back pain.  Her legs feel weak at times.  She gets cramping in her legs at night.  She is still wanting to proceed with surgery.  She had a previous injection in July 2021 on the left at L4-5 and L5-S1 that she reports gave her zero relief.  She reports she did not get any relief whatsoever, not even for a short period of time.  She reports she is nicotine free.  Her last A1C on 01/15/2021 was 7.   Patient notes she has seen her cardiologist recently and was cleared for spine surgery.  She will get [place] those notes from that office visit.  The patient has been on Tymlos for the past four months.           ROS: Review of Systems   Musculoskeletal: Positive for arthralgias and back pain.   All other systems reviewed and are negative.      PHYSICAL EXAM   Blood pressure (!) 147/64, pulse 54, resp. rate 16, height 154.9 cm (5' 1), weight 60.8 kg (134 lb), SpO2 97 %.  Body mass index is 25.32 kg/m?Marland Kitchen     Pain Score: Seven    Constitutional: Alert, NAD  Head: Atraumatic  Eyes: EOMI  Respiratory: Unlabored breathing  Cardiovascular: Regular rate  Skin: No rashes or open wounds appreciated on back  Musculoskeletal: Strength stable  Neurologic: Sensation stable    Unchanged.    RADIOGRAPHS     Exam: CT L-SPINE WO CONTRAST     CLINICAL INDICATION: 80 years Female. Lumbar ddd, foraminal stenosis,   surgical planning     COMPARISON: Lumbar spine radiographs from 01/15/2021.     TECHNIQUE: Multiple consecutive thin section axial CT images of the lumbar   spine was acquired with coronal and sagittal reconstructed images rendered   from the raw data.     Examination is acquired for surgical purposes.     FINDINGS: Severe degenerative disc disease at L2-L3, L4-L5, and L5-S1.   Disc space is preserved at L3-L4. Moderate degenerative disc disease at   T12-L1 and L1-L2. Prior MRI lumbar spine better depicts soft tissue and   disc pathology. There is at least moderate central spinal stenosis at   L2-L3, L3-L4, and moderate to severe at L4-L5. Severe L4-L5 and L5-S1   facet osteoarthritis, moderate at the remaining levels of the lumbar   spine. Dense arterial calcifications. Moderate to severe bilateral L4-L5   and L5-S1 osseous neural foraminal stenosis. Mild arthrosis of both hips.   Mild bilateral sacroiliac joint arthrosis and symphysis pubis arthrosis.     IMPRESSION       1. ?Severe degenerative disc disease at L2-L3, L4-5, and L5-S1 with   estimated stenoses as above better seen on prior MRI.       ?Finalized by Newman Nickels, M.D. on 02/15/2021 9:51 AM. Dictated by Newman Nickels, M.D. on 02/14/2021 4:58 PM.        ASSESSMENT /  PLAN     Sharon Cooley is a 80 y.o. female with:    1. DDD (degenerative disc disease), lumbar     2. Spinal stenosis of lumbar region with radiculopathy     3. Lumbar pain     4. Foraminal stenosis of lumbar region     5. Osteopenia of necks of both femurs     6. Impaired mobility and activities of daily living     7. Type 2 diabetes mellitus without complication, without long-term current use of insulin (HCC)           I have discussed the nature of their complaints, as well as the imaging findings.  I believe that their condition could be improved with a stabilization-type procedure focused on L4-5 and L5-S1.  We discussed this could be achieved in multiple ways, including all posterior surgery with or without an interbody device, versus anterior and posterior surgery.  We discussed the benefits and risks associated with both approaches.  It would be my preference to perform this procedure using an anterior lumbar interbody fusion device followed by posterior fixation and possible fusion.  I believe that placing an implant from an anterior approach would allow me to perform an adequate discectomy, place a large foot print implant, and fusion material, as well as restore the disc height and address the foraminal stenosis.  After discussing these options the patient is agreeable to the anterior posterior type procedure.  We will coordinate with one of our access surgeons at a mutually agreeable date.  We would talk with Dr. Hollie Beach for access given the calcifications in her vasculature of the lumbar spine.    She has been on Tymlos now for four months due to low bone density at minus 2.1.  Interestingly, the 4-5 and 5-1 segments are very sclerotic.  I think that actually that is helpful for [place] in the terms of bone density and quality to prevent cage subsidence.  Certainly at the time of surgery we will have to evaluate the quality of this bone.  I would recommend we use bone morphogenic protein given her low bone density as well as history of smoking, though she has been quit since September of 2021.  She has worked really hard to get here.  I think she is still having pain that would warrant an intervention.  I think she is interested in pursuing surgery.  I have asked her to talk with her cardiologist to  make sure there is nothing else to do from the standpoint of pre-operative testing such as an ECHO or EKG or some other stress test to make sure she would be fit to go through this type of operation and she and her daughter-in-law will work on that part of this.  Once we hear back from Dr. Hollie Beach and his office, we can certainly talk about scheduling and timelines.           In the presence of Marcelline Deist, MD , I have taken down these notes, Mamie Laurel, Scribe. February 28, 2021 12:59 PM      Apolinar Junes B. Lisette Grinder, MD, MPH  Spinal Surgery  Liz Beach. Clydene Pugh MD, [facility]  Nurse: Laverle Patter, BSN, RN, CNOR   [phone]  -  [email or web address]

## 2021-03-01 ENCOUNTER — Encounter: Admit: 2021-03-01 | Discharge: 2021-03-01 | Payer: MEDICARE

## 2021-03-01 NOTE — Progress Notes
Patinet's daughter in law here to pick up 1 mo supply of tymlos samples and needles.

## 2021-03-06 ENCOUNTER — Encounter: Admit: 2021-03-06 | Discharge: 2021-03-06 | Payer: MEDICARE

## 2021-03-06 NOTE — Telephone Encounter
Patient called stating that she needs to have spine surgery but will require cardiac clearance prior to proceeding.  Will review with Dr. Hale Bogus and get back to patient with Dr. Francee Nodal recommendations.

## 2021-03-08 ENCOUNTER — Encounter: Admit: 2021-03-08 | Discharge: 2021-03-08 | Payer: MEDICARE

## 2021-03-08 DIAGNOSIS — Z419 Encounter for procedure for purposes other than remedying health state, unspecified: Secondary | ICD-10-CM

## 2021-03-08 DIAGNOSIS — M5136 Other intervertebral disc degeneration, lumbar region: Secondary | ICD-10-CM

## 2021-03-08 DIAGNOSIS — M48061 Spinal stenosis, lumbar region without neurogenic claudication: Secondary | ICD-10-CM

## 2021-03-08 NOTE — Telephone Encounter
Called patient, lvm informing her surgery is scheduled for 4/22.  Preop and PAC appts scheduled on 3/29.  Instructed patient to contact cardiologist prior to 3/29 for cardiac clearance.

## 2021-03-12 ENCOUNTER — Encounter: Admit: 2021-03-12 | Discharge: 2021-03-12 | Payer: MEDICARE

## 2021-03-12 DIAGNOSIS — I10 Essential (primary) hypertension: Secondary | ICD-10-CM

## 2021-03-12 MED ORDER — HYDROCHLOROTHIAZIDE 25 MG PO TAB
ORAL_TABLET | Freq: Every day | ORAL | 3 refills | 28.00000 days | Status: AC
Start: 2021-03-12 — End: ?

## 2021-03-13 ENCOUNTER — Encounter: Admit: 2021-03-13 | Discharge: 2021-03-13 | Payer: MEDICARE

## 2021-03-13 NOTE — Telephone Encounter
Patient cleared for spine surgery per Dr. Hale Bogus.  No further cardiac testing is needed at this time.  Patient stable from cardiovascular standpoint

## 2021-03-14 ENCOUNTER — Encounter: Admit: 2021-03-14 | Discharge: 2021-03-14 | Payer: MEDICARE

## 2021-03-15 ENCOUNTER — Encounter: Admit: 2021-03-15 | Discharge: 2021-03-15 | Payer: MEDICARE

## 2021-03-15 ENCOUNTER — Inpatient Hospital Stay: Admit: 2021-03-15 | Discharge: 2021-03-15 | Payer: MEDICARE

## 2021-03-15 DIAGNOSIS — M5136 Other intervertebral disc degeneration, lumbar region: Secondary | ICD-10-CM

## 2021-03-15 DIAGNOSIS — M48061 Spinal stenosis, lumbar region without neurogenic claudication: Secondary | ICD-10-CM

## 2021-03-15 DIAGNOSIS — Z20822 Encounter for screening laboratory testing for COVID-19 virus in asymptomatic patient: Secondary | ICD-10-CM

## 2021-03-15 DIAGNOSIS — Z419 Encounter for procedure for purposes other than remedying health state, unspecified: Secondary | ICD-10-CM

## 2021-03-19 ENCOUNTER — Encounter: Admit: 2021-03-19 | Discharge: 2021-03-19 | Payer: MEDICARE

## 2021-03-25 ENCOUNTER — Encounter: Admit: 2021-03-25 | Discharge: 2021-03-25 | Payer: MEDICARE

## 2021-03-25 NOTE — Telephone Encounter
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  Patient is to start radiation treatment this week.  Informed daughter preop appt will be cancelled, surgery will be postpone.  Sent message to [facility] to cancel [facility] appt on 3/29.

## 2021-04-19 ENCOUNTER — Encounter: Admit: 2021-04-19 | Discharge: 2021-04-19 | Payer: MEDICARE

## 2021-09-28 DEATH — deceased

## 2022-02-12 ENCOUNTER — Encounter: Admit: 2022-02-12 | Discharge: 2022-02-12 | Payer: MEDICARE

## 2024-03-28 ENCOUNTER — Encounter: Admit: 2024-03-28 | Discharge: 2024-03-28
